# Patient Record
Sex: Female | Born: 1950 | Race: White | Hispanic: No | Smoking: Former smoker
Health system: Southern US, Community
[De-identification: ages and names within clinical notes are randomized; demographics above are authoritative.]

## PROBLEM LIST (undated history)

## (undated) DIAGNOSIS — M2550 Pain in unspecified joint: Secondary | ICD-10-CM

## (undated) DIAGNOSIS — F32A Depression, unspecified: Secondary | ICD-10-CM

## (undated) DIAGNOSIS — N281 Cyst of kidney, acquired: Secondary | ICD-10-CM

## (undated) DIAGNOSIS — M1990 Unspecified osteoarthritis, unspecified site: Secondary | ICD-10-CM

## (undated) DIAGNOSIS — M549 Dorsalgia, unspecified: Secondary | ICD-10-CM

## (undated) HISTORY — DX: Depression, unspecified: F32.A

## (undated) HISTORY — DX: Unspecified osteoarthritis, unspecified site: M19.90

## (undated) HISTORY — DX: Dorsalgia, unspecified: M54.9

## (undated) HISTORY — DX: Pain in unspecified joint: M25.50

## (undated) HISTORY — PX: CHOLECYSTECTOMY: SHX55

## (undated) HISTORY — PX: GASTRIC BYPASS: SHX52

## (undated) HISTORY — PX: BLADDER SUSPENSION: SHX72

---

## 2020-01-29 LAB — COLOGUARD: COLOGUARD: NEGATIVE

## 2020-01-29 LAB — EXTERNAL GENERIC LAB PROCEDURE: COLOGUARD: NEGATIVE

## 2021-08-26 ENCOUNTER — Emergency Department
Admission: EM | Admit: 2021-08-26 | Discharge: 2021-08-26 | Disposition: A | Discharge status: Home / Self Care | Payer: No Typology Code available for payment source | Attending: Emergency Medicine | Admitting: Emergency Medicine

## 2021-08-26 ENCOUNTER — Other Ambulatory Visit: Payer: Self-pay

## 2021-08-26 ENCOUNTER — Encounter: Payer: Self-pay | Admitting: Emergency Medicine

## 2021-08-26 DIAGNOSIS — N72 Inflammatory disease of cervix uteri: Secondary | ICD-10-CM | POA: Insufficient documentation

## 2021-08-26 DIAGNOSIS — N898 Other specified noninflammatory disorders of vagina: Secondary | ICD-10-CM | POA: Diagnosis present

## 2021-08-26 LAB — URINALYSIS, COMPLETE (UACMP) WITH MICROSCOPIC
Bilirubin Urine: NEGATIVE
Glucose, UA: NEGATIVE mg/dL
Hgb urine dipstick: NEGATIVE
Ketones, ur: NEGATIVE mg/dL
Nitrite: NEGATIVE
Protein, ur: NEGATIVE mg/dL
Specific Gravity, Urine: 1.025 (ref 1.005–1.030)
WBC, UA: >50 WBC/hpf — ABNORMAL HIGH (ref 0–5)
pH: 5 (ref 5.0–8.0)

## 2021-08-26 LAB — CHLAMYDIA/NGC RT PCR (ARMC ONLY)
Chlamydia Tr: NOT DETECTED
N gonorrhoeae: DETECTED — AB

## 2021-08-26 LAB — WET PREP, GENITAL
Clue Cells Wet Prep HPF POC: NONE SEEN
Sperm: NONE SEEN
Trich, Wet Prep: NONE SEEN
WBC, Wet Prep HPF POC: >10 — AB (ref <10)

## 2021-08-26 MED ORDER — DOXYCYCLINE MONOHYDRATE 100 MG PO TABS
100.0000 mg | ORAL_TABLET | Freq: Two times a day (BID) | ORAL | 0 refills | Status: AC
Start: 2021-08-26 — End: 2021-09-02

## 2021-08-26 MED ORDER — DOXYCYCLINE HYCLATE 100 MG PO TABS
100.0000 mg | ORAL_TABLET | Freq: Once | ORAL | Status: AC
  Administered 2021-08-26: 100 mg via ORAL
  Filled 2021-08-26: qty 1

## 2021-08-26 MED ORDER — FLUCONAZOLE 100 MG PO TABS
150.0000 mg | ORAL_TABLET | Freq: Once | ORAL | Status: AC
Start: 2021-08-26 — End: 2021-08-26
  Administered 2021-08-26: 150 mg via ORAL
  Filled 2021-08-26: qty 1

## 2021-08-26 MED ORDER — CEFTRIAXONE SODIUM 1 G IJ SOLR
500.0000 mg | Freq: Once | INTRAMUSCULAR | Status: AC
  Administered 2021-08-26: 500 mg via INTRAMUSCULAR
  Filled 2021-08-26: qty 10

## 2021-08-26 NOTE — ED Provider Notes (Signed)
? ?[facility] ?Provider Note ? ? ? Event Date/Time  ? First MD Initiated Contact with Patient 08/26/21 1729   ?  (approximate) ? ? ?History  ? ?Vaginal Discharge ? ? ?HPI ? ?Yvonne Eaton is a 71 y.o. female with no significant past medical history presents with vaginal discharge.  Patient notes that she had unprotected intercourse with a new partner about a week ago.  He then told her that she likely has an STD.  She went to urgent care initially and was tested for STDs including HIV syphilis trichomonas gonorrhea and chlamydia but was not treated.  The gonorrhea and Chlamydia test of still not come back.  Over the last several days she has noticed brown discharge.  After intercourse she did use a douche.  Denies abdominal pain dysuria, fevers or chills ?  ? ?No past medical history on file. ? ?There are no problems to display for this patient. ? ? ? ?Physical Exam  ?Triage Vital Signs: ?ED Triage Vitals  ?Enc Vitals Group  ?   BP 08/26/21 1713 (!) 159/82  ?   Pulse Rate 08/26/21 1713 72  ?   Resp 08/26/21 1713 20  ?   Temp 08/26/21 1713 98.4 ?F (36.9 ?C)  ?   Temp Source 08/26/21 1713 Oral  ?   SpO2 08/26/21 1713 99 %  ?   Weight 08/26/21 1710 151 lb (68.5 kg)  ?   Height 08/26/21 1710 5\' 3"  (1.6 m)  ?   Head Circumference --   ?   Peak Flow --   ?   Pain Score 08/26/21 1709 0  ?   Pain Loc --   ?   Pain Edu? --   ?   Excl. in GC? --   ? ? ?Most recent vital signs: ?Vitals:  ? 08/26/21 1713  ?BP: (!) 159/82  ?Pulse: 72  ?Resp: 20  ?Temp: 98.4 ?F (36.9 ?C)  ?SpO2: 99%  ? ? ? ?General: Awake, no distress.  ?CV:  Good peripheral perfusion.  ?Resp:  Normal effort.  ?Abd:  No distention.  ?Neuro:             Awake, Alert, Oriented x 3  ?Other:  Increased Greenish-yellow discharge on speculum exam with friable cervix ?No cervical motion tenderness ? ? ?ED Results / Procedures / Treatments  ?Labs ?(all labs ordered are listed, but only abnormal results are displayed) ?Labs Reviewed  ?WET PREP,  GENITAL - Abnormal; Notable for the following components:  ?    Result Value  ? Yeast Wet Prep HPF POC PRESENT (*)   ? WBC, Wet Prep HPF POC >10 (*)   ? All other components within normal limits  ?URINALYSIS, COMPLETE (UACMP) WITH MICROSCOPIC - Abnormal; Notable for the following components:  ? Color, Urine YELLOW (*)   ? APPearance HAZY (*)   ? Leukocytes,Ua LARGE (*)   ? WBC, UA >50 (*)   ? Bacteria, UA RARE (*)   ? All other components within normal limits  ?CHLAMYDIA/NGC RT PCR ([facility] ONLY)            ? ? ? ?EKG ? ? ? ? ?RADIOLOGY ? ? ? ?PROCEDURES: ? ?Critical Care performed: No ? ? ?MEDICATIONS ORDERED IN ED: ?Medications  ?cefTRIAXone (ROCEPHIN) injection 500 mg (500 mg Intramuscular Given 08/26/21 1843)  ?doxycycline (VIBRA-TABS) tablet 100 mg (100 mg Oral Given 08/26/21 1846)  ?fluconazole (DIFLUCAN) tablet 150 mg (150 mg Oral Given 08/26/21 1910)  ? ? ? ?  IMPRESSION / MDM / ASSESSMENT AND PLAN / ED COURSE  ?I reviewed the triage vital signs and the nursing notes. ?             ?               ? ?Differential diagnosis includes, but is not limited to, gonorrhea chlamydia cervicitis, trichomonas, BV, yeast infection, irritant dermatitis ? ?Patient is a 71 year old female presents with vaginal discharge after having unprotected intercourse with a new partner.  Initially went to urgent care and had STD testing however on review of records I do not see that the gonorrhea and Chlamydia test ever resulted.  She is now having vaginal discharge.  On exam she has copious yellow/green frothy discharge with a somewhat friable cervix.  Looks to me like trichomoniasis or chlamydia gonorrhea.  She has no cervical motion tenderness no abdominal tenderness or fever to suggest PID.  Wet prep is negative for clue cells and trichomoniasis but does have yeast present.  UA with greater than 50 WBCs, spectrums in the setting of the cervicitis.  Patient prefers to be treated empirically for gonorrhea and chlamydia which we will do.   She was given IM ceftriaxone in the first dose of doxycycline.  Also got fluconazole for potential yeast infection.  Discussed checking her results on MyChart and refraining from intercourse for 1 week..  She is appropriate discharge. ? ?Clinical Course as of 08/26/21 1912  ?Sun Aug 26, 2021  ?1901 WBC, Wet Prep HPF POC(!): >10 [KM]  ?  ?Clinical Course User Index ?[KM] Georga Hacking, MD  ? ? ? ?FINAL CLINICAL IMPRESSION(S) / ED DIAGNOSES  ? ?Final diagnoses:  ?Cervicitis  ? ? ? ?Rx / DC Orders  ? ?ED Discharge Orders   ? ?      Ordered  ?  doxycycline (ADOXA) 100 MG tablet  2 times daily       ? 08/26/21 1904  ? ?  ?  ? ?  ? ? ? ?Note:  This document was prepared using Dragon voice recognition software and may include unintentional dictation errors. ?  ?Georga Hacking, MD ?08/26/21 1912 ? ?

## 2021-08-26 NOTE — ED Notes (Signed)
Pelvic cart at door 

## 2021-08-26 NOTE — ED Triage Notes (Addendum)
Pt via POV from home. Pt states she had intercourse with someone and they told her she needed to get checked. States she also reused a douche bottle yesterday. Pt started having some vaginal discharge, yellowish color. Denies any pain. Denies any troubles peeing. States that she got tested for many different STDs on Tuesday. Pt is A&OX4 and NAD.  ?

## 2021-08-26 NOTE — Discharge Instructions (Signed)
Your gonorrhea and Chlamydia test are pending.  We have started the treatment for this with one shot in the emergency department and you will be discharged with prescription for doxycycline which she should take twice a day for the next week.  Your testing for trichomonas and BV were negative however did look like there was yeast as well which we have treated you for.  Please refrain from intercourse until you are fully treated. ?

## 2021-08-26 NOTE — ED Notes (Signed)
Pt here for STD testing after having some abnormal vaginal discharge and a sexual partner who told her to get tested- pt already provided urine sample in triage ?

## 2021-08-27 ENCOUNTER — Telehealth: Payer: Self-pay | Admitting: Emergency Medicine

## 2021-08-27 NOTE — Telephone Encounter (Signed)
Called patient and informed of STD results positive for gonorrhea.  She was treated and she has gotten her presciption for doxycycline and is taking that.  Advised to follow up with her doctor.  No intercourse for at least 7i days and inform partner. ?

## 2021-08-31 ENCOUNTER — Emergency Department
Admission: EM | Admit: 2021-08-31 | Discharge: 2021-08-31 | Disposition: A | Discharge status: Home / Self Care | Payer: 59 | Attending: Emergency Medicine | Admitting: Emergency Medicine

## 2021-08-31 ENCOUNTER — Other Ambulatory Visit: Payer: Self-pay

## 2021-08-31 ENCOUNTER — Encounter: Payer: Self-pay | Admitting: Intensive Care

## 2021-08-31 DIAGNOSIS — R319 Hematuria, unspecified: Secondary | ICD-10-CM | POA: Insufficient documentation

## 2021-08-31 DIAGNOSIS — N898 Other specified noninflammatory disorders of vagina: Secondary | ICD-10-CM | POA: Insufficient documentation

## 2021-08-31 MED ORDER — LIDOCAINE HCL (PF) 1 % IJ SOLN
2.0000 mL | Freq: Once | INTRAMUSCULAR | Status: AC
  Administered 2021-08-31: 2 mL
  Filled 2021-08-31: qty 5

## 2021-08-31 MED ORDER — CEFTRIAXONE SODIUM 1 G IJ SOLR
1.0000 g | Freq: Once | INTRAMUSCULAR | Status: AC
  Administered 2021-08-31: 1 g via INTRAMUSCULAR
  Filled 2021-08-31: qty 10

## 2021-08-31 NOTE — ED Provider Notes (Signed)
? ?[facility] ?Provider Note ? ? ? Event Date/Time  ? First MD Initiated Contact with Patient 08/31/21 1748   ?  (approximate) ? ? ?History  ? ?Hematuria ? ? ?HPI ? ?Yvonne Eaton is a 71 y.o. female   with no significant past medical history who was recently diagnosed with gonorrhea 5 days ago, treated with IM Rocephin and oral doxycycline along with Diflucan who comes the ED reporting that she has noticed some red in her urine, some persistent vaginal discharge which has improved but not completely resolved.  She denies any pain fever or chills.  No dysuria frequency or urgency.  She is scheduled to see her primary care doctor in 3 days. ? ?  ? ? ?Physical Exam  ? ?Triage Vital Signs: ?ED Triage Vitals  ?Enc Vitals Group  ?   BP 08/31/21 1708 (!) 146/68  ?   Pulse Rate 08/31/21 1708 84  ?   Resp 08/31/21 1708 16  ?   Temp 08/31/21 1708 97.9 ?F (36.6 ?C)  ?   Temp Source 08/31/21 1708 Oral  ?   SpO2 08/31/21 1708 96 %  ?   Weight 08/31/21 1709 151 lb (68.5 kg)  ?   Height 08/31/21 1709 5' 3.25" (1.607 m)  ?   Head Circumference --   ?   Peak Flow --   ?   Pain Score 08/31/21 1709 0  ?   Pain Loc --   ?   Pain Edu? --   ?   Excl. in GC? --   ? ? ?Most recent vital signs: ?Vitals:  ? 08/31/21 1708  ?BP: (!) 146/68  ?Pulse: 84  ?Resp: 16  ?Temp: 97.9 ?F (36.6 ?C)  ?SpO2: 96%  ? ? ? ?General: Awake, no distress.  ?CV:  Good peripheral perfusion.  Regular rate and rhythm ?Resp:  Normal effort.  Clear to auscultation bilaterally ?Abd:  No distention.  Soft nontender ?Other:  Moist oral mucosa ? ? ?ED Results / Procedures / Treatments  ? ?Labs ?(all labs ordered are listed, but only abnormal results are displayed) ?Labs Reviewed - No data to display ? ? ?EKG ? ? ? ? ?RADIOLOGY ? ? ? ? ?PROCEDURES: ? ?Critical Care performed: No ? ?Procedures ? ? ?MEDICATIONS ORDERED IN ED: ?Medications  ?cefTRIAXone (ROCEPHIN) injection 1 g (has no administration in time range)  ?lidocaine (PF) (XYLOCAINE) 1 %  injection 2 mL (has no administration in time range)  ? ? ? ?IMPRESSION / MDM / ASSESSMENT AND PLAN / ED COURSE  ?I reviewed the triage vital signs and the nursing notes. ?             ?               ?Patient presents with report of persistent vaginal discharge after recent treatment for gonorrhea.  We will give a repeat dose of Rocephin today, advised her to continue her full course of doxycycline.  Keep her appointment with her doctor.  Counseled her on needing to have repeat evaluation and testing once treatment course is complete to ensure that any bleeding and discharge have resolved and determine if pelvic ultrasound or gynecology referral is necessary. ? ? ?FINAL CLINICAL IMPRESSION(S) / ED DIAGNOSES  ? ?Final diagnoses:  ?Vaginal discharge  ? ? ? ?Rx / DC Orders  ? ?ED Discharge Orders   ? ? None  ? ?  ? ? ? ?Note:  This document was prepared using Dragon voice  recognition software and may include unintentional dictation errors. ?  ?Sharman Cheek, MD ?08/31/21 1854 ? ?

## 2021-08-31 NOTE — ED Triage Notes (Signed)
Patient seen here on 08/26/21 and diagnosed with STD. Reports she has 5 doses left of antibiotic and today noticed red in urine and red on her pad. Denies other urinary symptoms ?

## 2021-08-31 NOTE — Discharge Instructions (Addendum)
Follow up with your doctor for repeat evaluation and testing to ensure that the bleeding and discharge resolve after treatment.  If symptoms have not resolved within a week, please follow-up with gynecology. ?

## 2021-08-31 NOTE — ED Notes (Signed)
Pt verbalized understanding of discharge instructions and follow-up care instructions. Pt advised if symptoms worsen to return to ED.  

## 2021-08-31 NOTE — ED Notes (Addendum)
See triage note. Pt has been feeling tired more than lately. Pt thinks it is due to being on antibiotics due to being diagnosed with STD a few days ago. Pt states she has noticed red color in her urine that started today.  ?

## 2021-10-19 ENCOUNTER — Other Ambulatory Visit: Payer: Self-pay | Admitting: Orthopaedic Surgery

## 2021-10-19 DIAGNOSIS — Z01818 Encounter for other preprocedural examination: Secondary | ICD-10-CM

## 2021-10-24 ENCOUNTER — Other Ambulatory Visit: Payer: No Typology Code available for payment source

## 2021-10-29 ENCOUNTER — Ambulatory Visit
Admission: RE | Admit: 2021-10-29 | Discharge: 2021-10-29 | Disposition: A | Discharge status: Home / Self Care | Payer: 59 | Source: Ambulatory Visit | Attending: Orthopaedic Surgery | Admitting: Orthopaedic Surgery

## 2021-10-29 DIAGNOSIS — Z01818 Encounter for other preprocedural examination: Secondary | ICD-10-CM

## 2022-03-20 ENCOUNTER — Other Ambulatory Visit: Payer: Self-pay

## 2022-03-20 ENCOUNTER — Emergency Department: Payer: 59

## 2022-03-20 ENCOUNTER — Emergency Department
Admission: EM | Admit: 2022-03-20 | Discharge: 2022-03-20 | Disposition: A | Discharge status: Home / Self Care | Payer: 59 | Attending: Emergency Medicine | Admitting: Emergency Medicine

## 2022-03-20 ENCOUNTER — Encounter: Payer: Self-pay | Admitting: *Deleted

## 2022-03-20 DIAGNOSIS — W19XXXA Unspecified fall, initial encounter: Secondary | ICD-10-CM

## 2022-03-20 DIAGNOSIS — S0990XA Unspecified injury of head, initial encounter: Secondary | ICD-10-CM | POA: Diagnosis present

## 2022-03-20 DIAGNOSIS — W108XXA Fall (on) (from) other stairs and steps, initial encounter: Secondary | ICD-10-CM | POA: Diagnosis not present

## 2022-03-20 DIAGNOSIS — M25511 Pain in right shoulder: Secondary | ICD-10-CM | POA: Diagnosis not present

## 2022-03-20 MED ORDER — OXYCODONE-ACETAMINOPHEN 5-325 MG PO TABS
1.0000 | ORAL_TABLET | Freq: Once | ORAL | Status: AC
  Administered 2022-03-20: 1 via ORAL
  Filled 2022-03-20: qty 1

## 2022-03-20 NOTE — ED Triage Notes (Addendum)
BIB  [facility] from home s/p fall, tripped over purse, fell down 3 steps, hit head, denies blood thinners, no LOC, c/o R shoulder and neck pain, R elbow abrasion noted. Denies recent falls. TD <5 yrs. CMS intact. BP elevated. No IV, no CBG. C-collar in place.

## 2022-03-20 NOTE — Discharge Instructions (Addendum)
Your x-ray did not show any broken bone in your shoulder.  You likely bruised it or may have injured your rotator cuff.  You can wear the sling but only do so for 5 days maximum and make sure you are taking your arm out and mobilizing your shoulder so that you avoid getting a frozen shoulder.  You can take Tylenol Motrin and your tramadol as needed.  Please follow-up with your orthopedist.

## 2022-03-20 NOTE — ED Provider Notes (Signed)
[facility] Provider Note    Event Date/Time   First MD Initiated Contact with Patient 03/20/22 1813     (approximate)   History   Fall   HPI  Yvonne Eaton is a 71 y.o. female past medical history of recurrent UTI who presents after a fall.  Patient was getting out of the car when she tripped on her purse fell backward into the right.  She did hit her head did not lose consciousness.  She hit her right shoulder.  She has osteoarthritis of the right shoulder and is planning to get a shoulder replacement but now pain is much worse.  Denies numbness or tingling.  Denies any lower extremity symptoms.  No chest pain or abdominal pain.  She is not anticoagulated.     History reviewed. No pertinent past medical history.  There are no problems to display for this patient.    Physical Exam  Triage Vital Signs: ED Triage Vitals  Enc Vitals Group     BP 03/20/22 1805 (!) 178/89     Pulse Rate 03/20/22 1805 79     Resp --      Temp --      Temp src --      SpO2 03/20/22 1805 100 %     Weight 03/20/22 1811 189 lb (85.7 kg)     Height 03/20/22 1811 5\' 3"  (1.6 m)     Head Circumference --      Peak Flow --      Pain Score 03/20/22 1811 9     Pain Loc --      Pain Edu? --      Excl. in GC? --     Most recent vital signs: Vitals:   03/20/22 1939 03/20/22 2023  BP:  (!) 177/82  Pulse:  66  Resp:  20  Temp: 98.4 F (36.9 C) 98.3 F (36.8 C)  SpO2:  100%     General: Awake, no distress.  CV:  Good peripheral perfusion.  Resp:  Normal effort.  Abd:  No distention.  Neuro:             Awake, Alert, Oriented x 3  Other:  Tenderness to palpation over the right AC joint, able to fully flex but significant pain with abduction 2+ radial pulse, intact thumbs up, okay sign and finger abduction Small abrasion over the elbow No signs of trauma to the head, PERRLA  C-collar in place   ED Results / Procedures / Treatments  Labs (all labs ordered are  listed, but only abnormal results are displayed) Labs Reviewed - No data to display   EKG     RADIOLOGY I reviewed and interpreted the CT scan of the brain which does not show any acute intracranial process    PROCEDURES:  Critical Care performed: No  .1-3 Lead EKG Interpretation  Performed by: 05/20/22, MD Authorized by: Georga Hacking, MD     Interpretation: normal     ECG rate assessment: normal     Rhythm: sinus rhythm     Ectopy: none     Conduction: normal     The patient is on the cardiac monitor to evaluate for evidence of arrhythmia and/or significant heart rate changes.   MEDICATIONS ORDERED IN ED: Medications  oxyCODONE-acetaminophen (PERCOCET/ROXICET) 5-325 MG per tablet 1 tablet (1 tablet Oral Given 03/20/22 1938)     IMPRESSION / MDM / ASSESSMENT AND PLAN / ED COURSE  I  reviewed the triage vital signs and the nursing notes.                              Patient's presentation is most consistent with acute presentation with potential threat to life or bodily function.  Differential diagnosis includes, but is not limited to, cervical spine fracture, intracranial hemorrhage, AC joint separation, humeral head fracture,  Patient is a 71 year old female who presents after mechanical fall.  [place] [address] backwards getting out of the car today because she tripped over her purse.  She hit her head but did not lose consciousness.  She complains of pain on the right side of her neck and right shoulder.  She does have difficulty ranging the shoulder with abduction and is tender over the [facility] joint there is no obvious deformity and she is distally neurovascular intact.  She has no chest wall or abdominal tenderness.  Plan to obtain CT head and C-spine as well as x-ray of the right shoulder and treat her pain with Percocet.  X-ray of the shoulder has no acute findings.  CT head and C-spine are also negative.  Patient placed in sling for comfort but advised early  range of motion exercises to avoid frozen shoulder.  She will follow-up with her orthopedist.     FINAL CLINICAL IMPRESSION(S) / ED DIAGNOSES   Final diagnoses:  Acute pain of right shoulder  Fall, initial encounter     Rx / DC Orders   ED Discharge Orders     None        Note:  This document was prepared using Dragon voice recognition software and may include unintentional dictation errors.   Rada Hay, MD 03/21/22 [phone]

## 2022-03-20 NOTE — ED Notes (Addendum)
EDP into room, at [facility]. Guarding R shoulder.

## 2022-09-02 ENCOUNTER — Other Ambulatory Visit: Payer: Self-pay | Admitting: Student

## 2022-09-02 DIAGNOSIS — Z1231 Encounter for screening mammogram for malignant neoplasm of breast: Secondary | ICD-10-CM

## 2023-02-04 ENCOUNTER — Other Ambulatory Visit: Payer: Self-pay | Admitting: Nurse Practitioner

## 2023-02-04 DIAGNOSIS — Z1231 Encounter for screening mammogram for malignant neoplasm of breast: Secondary | ICD-10-CM

## 2023-02-05 ENCOUNTER — Other Ambulatory Visit: Payer: Self-pay | Admitting: Nurse Practitioner

## 2023-02-05 DIAGNOSIS — E2839 Other primary ovarian failure: Secondary | ICD-10-CM

## 2023-02-11 ENCOUNTER — Ambulatory Visit
Admission: RE | Admit: 2023-02-11 | Discharge: 2023-02-11 | Disposition: A | Discharge status: Home / Self Care | Payer: Medicare HMO | Source: Ambulatory Visit | Attending: Nurse Practitioner | Admitting: Nurse Practitioner

## 2023-02-11 DIAGNOSIS — Z1231 Encounter for screening mammogram for malignant neoplasm of breast: Secondary | ICD-10-CM

## 2023-02-28 ENCOUNTER — Encounter (HOSPITAL_COMMUNITY): Payer: Self-pay | Admitting: Physician Assistant

## 2023-02-28 ENCOUNTER — Telehealth (HOSPITAL_COMMUNITY): Payer: Self-pay

## 2023-02-28 ENCOUNTER — Ambulatory Visit (HOSPITAL_COMMUNITY)
Admission: EM | Admit: 2023-02-28 | Discharge: 2023-02-28 | Disposition: A | Discharge status: Home / Self Care | Payer: Medicare HMO | Attending: Physician Assistant | Admitting: Physician Assistant

## 2023-02-28 DIAGNOSIS — M5432 Sciatica, left side: Secondary | ICD-10-CM

## 2023-02-28 DIAGNOSIS — G5702 Lesion of sciatic nerve, left lower limb: Secondary | ICD-10-CM | POA: Diagnosis not present

## 2023-02-28 MED ORDER — BACLOFEN 5 MG PO TABS: 5.0000 mg | ORAL_TABLET | Freq: Every evening | ORAL | 0 refills | Status: DC | PRN

## 2023-02-28 MED ORDER — KETOROLAC TROMETHAMINE 30 MG/ML IJ SOLN
15.0000 mg | Freq: Once | INTRAMUSCULAR | Status: AC
  Administered 2023-02-28: 15 mg via INTRAMUSCULAR

## 2023-02-28 MED ORDER — TRAMADOL HCL 50 MG PO TABS: 50.0000 mg | ORAL_TABLET | Freq: Two times a day (BID) | ORAL | 0 refills | Status: AC | PRN

## 2023-02-28 MED ORDER — TRAMADOL HCL 50 MG PO TABS: 50.0000 mg | ORAL_TABLET | Freq: Two times a day (BID) | ORAL | 0 refills | Status: DC | PRN

## 2023-02-28 MED ORDER — KETOROLAC TROMETHAMINE 30 MG/ML IJ SOLN
INTRAMUSCULAR | Status: AC
  Filled 2023-02-28: qty 1

## 2023-02-28 NOTE — ED Provider Notes (Addendum)
[facility]    CSN: [id number] Arrival date & time: 02/28/23  1716      History   Chief Complaint Chief Complaint  Patient presents with   Back Pain   Leg Pain    HPI Yvonne Eaton is a 72 y.o. female.   Patient presents today with a several hour history of worsening left buttocks pain with radiation to her left leg.  She does have a history of intermittent back pain.  She has taken Celebrex as previously prescribed without improvement of symptoms.  She has not tried additional over-the-counter medications.  Denies previous injury or surgery involving her back.  She denies any known injury or increase in activity before symptoms began.  Pain is radiating between 6 and 8 on a 0-10 pain scale, described as shooting, worse with certain movements/prolonged standing or ambulation, no alleviating factors identified.  She denies any bowel/bladder incontinence, lower extremity weakness, saddle anesthesia.  She denies any fever, nausea, vomiting, urinary symptoms.  She is having difficulty with her daily activities as a result of symptoms.  Denies personal history of malignancy.    History reviewed. No pertinent past medical history.  There are no problems to display for this patient.   Past Surgical History:  Procedure Laterality Date   CHOLECYSTECTOMY     GASTRIC BYPASS      OB History   No obstetric history on file.      Home Medications    Prior to Admission medications   Medication Sig Start Date End Date Taking? Authorizing Provider  Baclofen 5 MG TABS Take 1 tablet (5 mg total) by mouth at bedtime as needed. 02/28/23  Yes Gabrial Domine K, PA-C  traMADol (ULTRAM) 50 MG tablet Take 1 tablet (50 mg total) by mouth every 12 (twelve) hours as needed for up to 2 days. 02/28/23 03/02/23 Yes Tasheema Perrone, Noberto Retort, PA-C    Family History History reviewed. No pertinent family history.  Social History Social History   Tobacco Use   Smoking status: Never   Smokeless  tobacco: Never  Substance Use Topics   Alcohol use: Yes    Comment: occasionally   Drug use: Yes    Types: Marijuana     Allergies   Morphine   Review of Systems Review of Systems  Constitutional:  Positive for activity change. Negative for appetite change, fatigue and fever.  Gastrointestinal:  Negative for abdominal pain, diarrhea, nausea and vomiting.  Musculoskeletal:  Positive for back pain. Negative for arthralgias and myalgias.  Neurological:  Positive for numbness. Negative for weakness.     Physical Exam Triage Vital Signs ED Triage Vitals  Encounter Vitals Group     BP 02/28/23 1805 (!) 166/75     Systolic BP Percentile --      Diastolic BP Percentile --      Pulse Rate 02/28/23 1805 68     Resp 02/28/23 1805 16     Temp 02/28/23 1805 98.1 F (36.7 C)     Temp Source 02/28/23 1805 Oral     SpO2 02/28/23 1805 97 %     Weight --      Height --      Head Circumference --      Peak Flow --      Pain Score 02/28/23 1807 6     Pain Loc --      Pain Education --      Exclude from Growth Chart --    No data found.  Updated Vital Signs BP (!) 166/75 (BP Location: Left Arm)   Pulse 68   Temp 98.1 F (36.7 C) (Oral)   Resp 16   SpO2 97%   Visual Acuity Right Eye Distance:   Left Eye Distance:   Bilateral Distance:    Right Eye Near:   Left Eye Near:    Bilateral Near:     Physical Exam Vitals reviewed.  Constitutional:      General: She is awake. She is not in acute distress.    Appearance: Normal appearance. She is well-developed. She is not ill-appearing.     Comments: Very pleasant female appears stated age in no acute distress sitting comfortably in exam room  HENT:     Head: Normocephalic and atraumatic.  Cardiovascular:     Rate and Rhythm: Normal rate and regular rhythm.     Heart sounds: S1 normal and S2 normal. Murmur heard.  Pulmonary:     Effort: Pulmonary effort is normal.     Breath sounds: Normal breath sounds. No wheezing,  rhonchi or rales.     Comments: Clear to auscultation bilaterally Musculoskeletal:     Cervical back: No tenderness or bony tenderness.     Thoracic back: No tenderness or bony tenderness.     Lumbar back: Tenderness present. No bony tenderness. Positive left straight leg raise test. Negative right straight leg raise test.     Comments: Back: Tender to palpation over lumbar/buttocks on the left.  Positive straight leg raise.  Strength 5/5.  No deformity or step-off noted.  Psychiatric:        Behavior: Behavior is cooperative.      UC Treatments / Results  Labs (all labs ordered are listed, but only abnormal results are displayed) Labs Reviewed - No data to display  EKG   Radiology No results found.  Procedures Procedures (including critical care time)  Medications Ordered in UC Medications  ketorolac (TORADOL) 30 MG/ML injection 15 mg (15 mg Intramuscular Given 02/28/23 1906)    Initial Impression / Assessment and Plan / UC Course  I have reviewed the triage vital signs and the nursing notes.  Pertinent labs & imaging results that were available during my care of the patient were reviewed by me and considered in my medical decision making (see chart for details).     Patient is well-appearing, afebrile, nontoxic, nontachycardic.  Vital signs of physical exam are reassuring with no indication for emergent evaluation or imaging.  She was given 15 mg of Toradol in clinic to help with pain and inflammation.  Discussed that she is not to take Celebrex for an additional 12 hours and she should limit use of this medication given her history of gastric bypass.  Metabolic panel from 08/26/2022 showed normal kidney function with a creatinine of 0.7.  She was encouraged to use heat and gentle stretch for symptom relief.  She was given low-dose baclofen to be taken at night and we discussed this can be sedating and she should not drive or drink alcohol taking it.  She reports being  previously prescribed tramadol which provided improvement of symptoms without sedation and is requesting this medication if appropriate today.  I given she is unable to take oral NSAIDs and we are limited in pain control we will consider short course of this medication.  She denies any history of seizures.  Review of [place] controlled substance database shows no inappropriate refills.  4 tablets were sent to pharmacy we discussed that we  cannot provide any refills.  Discussed that she should limit use of medication is much as possible that is both sedating and addictive and should space the dose of tramadol away from baclofen to avoid combining sedating medications.  If her symptoms are not improving she should follow-up with sports medicine and was given contact information for local provider with instruction to call to schedule an appointment.  Discussed that if anything worsens or changes she should return for reevaluation.  Final Clinical Impressions(s) / UC Diagnoses   Final diagnoses:  Piriformis syndrome of left side  Left sided sciatica     Discharge Instructions      We gave an injection of ketorolac today.  Please do not take your Celebrex for the next 12 hours.  You can use tramadol up to twice a day.  We can only provide you a few doses of this medication and cannot provide you a refill.  Try to limit use is much as possible as this medication is addictive and sedating.  Take baclofen at night.  This can also make you sleepy so do not drive or drink alcohol taking it.  You can use Tylenol for breakthrough pain.  If your symptoms are not improving quickly please follow-up with sports medicine; call to schedule an appointment.  If anything worsens and you go to the bathroom underside without noticing it, have severe pain, weakness in your legs, numbness or tingling on the inside of your legs you should be seen immediately.     ED Prescriptions     Medication Sig Dispense Auth.  Provider   traMADol (ULTRAM) 50 MG tablet Take 1 tablet (50 mg total) by mouth every 12 (twelve) hours as needed for up to 2 days. 4 tablet Maleah Rabago K, PA-C   Baclofen 5 MG TABS Take 1 tablet (5 mg total) by mouth at bedtime as needed. 10 tablet Terri Rorrer K, PA-C      I have reviewed the PDMP during this encounter.   Jeani Hawking, PA-C 02/28/23 1907    Shantel Wesely, Noberto Retort, PA-C 02/28/23 1908

## 2023-02-28 NOTE — Discharge Instructions (Signed)
We gave an injection of ketorolac today.  Please do not take your Celebrex for the next 12 hours.  You can use tramadol up to twice a day.  We can only provide you a few doses of this medication and cannot provide you a refill.  Try to limit use is much as possible as this medication is addictive and sedating.  Take baclofen at night.  This can also make you sleepy so do not drive or drink alcohol taking it.  You can use Tylenol for breakthrough pain.  If your symptoms are not improving quickly please follow-up with sports medicine; call to schedule an appointment.  If anything worsens and you go to the bathroom underside without noticing it, have severe pain, weakness in your legs, numbness or tingling on the inside of your legs you should be seen immediately.

## 2023-02-28 NOTE — ED Triage Notes (Signed)
Pt presents to the office for left side pain. Pt reports a history of sciatica pain.

## 2023-03-03 ENCOUNTER — Ambulatory Visit: Payer: Medicare HMO | Admitting: Family Medicine

## 2023-03-03 VITALS — BP 138/84 | Ht 63.75 in | Wt 182.0 lb

## 2023-03-03 DIAGNOSIS — M5432 Sciatica, left side: Secondary | ICD-10-CM | POA: Diagnosis not present

## 2023-03-03 MED ORDER — PREDNISONE 10 MG PO TABS: ORAL_TABLET | ORAL | 0 refills | Status: DC

## 2023-03-03 NOTE — Patient Instructions (Signed)
Take tylenol for baseline pain relief (1-2 extra strength tabs 3x/day) Take prednisone dose pack as directed. Stop the celebrex while on the prednisone.  Also do not take ibuprofen while on this. Take one of the tramadol only if pain is severe. you sleepy). Stay as active as possible. Physical therapy has been shown to be helpful as well. Do home exercises and stretches as directed. If not improving, will consider further imaging (MRI), formal physical therapy. Follow up with me in 1 month but call me over the next week if you're not improving.

## 2023-03-03 NOTE — Progress Notes (Unsigned)
PCP: Carren Rang, PA-C  Subjective:   HPI: Patient is a 72 y.o. female here for ED follow-up of sciatic pain.  Felt sudden pain on left side posterior hip that radiated down leg on Friday. Pain is sharp and it aches. Has noticed some tingling as well. Pain returns with activity on her feet. Not seeing a chiropractor here. No urinary or fecal incontinence, no saddle anesthesia. No trauma or new injury.  Seen in urgent care on 09/20. Told she had sciatica. Was given Toradol and tramadol which helped some.  States she has had this pain intermittently for many years. 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  She has previously been on gabapentin which she does not like because she does not feel it works and makes her sleepy.  Currently taking ibuprofen and Tylenol combo arthritis pill as well as celecoxib.  Notes that she has had steroid Dosepaks in the past which have helped.  No past medical history on file.  Current Outpatient Medications on File Prior to Visit  Medication Sig Dispense Refill   Baclofen 5 MG TABS Take 1 tablet (5 mg total) by mouth at bedtime as needed. 10 tablet 0   No current facility-administered medications on file prior to visit.    Past Surgical History:  Procedure Laterality Date   CHOLECYSTECTOMY     GASTRIC BYPASS      Allergies  Allergen Reactions   Morphine     BP 138/84   Ht 5' 3.75" (1.619 m)   Wt 182 lb (82.6 kg)   BMI 31.49 kg/m       No data to display              No data to display              Objective:  Physical Exam:  Gen: NAD, comfortable in exam room  Left hip and lumbar spine: Inspection: No obvious gross deformity, swelling, ecchymoses, erythema Palpation: Nontender to palpation over lumbar spinous processes, paraspinal muscles, left greater  trochanter Mildly tender to palpation at piriformis muscle Range of Motion: Internal rotation of left hip limited to 15 degrees Full range of motion of hip otherwise Special Tests FABER reproduces pain in posterior hip Positive left sided straight leg raise No pain with resisted external rotation of hip Positive piriformis stretch Strength: 5/5 in lumbosacral plexus L2-S2 distribution    Assessment & Plan:   Assessment & Plan Sciatica of left side Patient with 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  Reassuringly no red flag symptoms to suggest damage to her spinal cord and she has not had any new injuries to suggest alternative cause of her pain.  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  Will not repeat imaging at this time as it would not change management. Discussed repeat steroid Dosepak to treat her flareup.  Patient agreeable to this plan.  Also provided lumbar and hip strengthening/stretching exercises.  Follow-up as needed.  Prednisone taper ordered.   Some or all of this note was dictated use Animal nutritionist, any errors are unintentional.  Celine Mans, MD, PGY-2 [facility] 4:38 PM 03/03/2023

## 2023-03-04 ENCOUNTER — Encounter: Payer: Self-pay | Admitting: Family Medicine

## 2023-03-10 ENCOUNTER — Ambulatory Visit: Payer: Medicare HMO | Admitting: Family Medicine

## 2023-03-31 ENCOUNTER — Ambulatory Visit (HOSPITAL_COMMUNITY)
Admission: EM | Admit: 2023-03-31 | Discharge: 2023-03-31 | Disposition: A | Discharge status: Home / Self Care | Payer: Medicare HMO

## 2023-03-31 ENCOUNTER — Encounter (HOSPITAL_COMMUNITY): Payer: Self-pay

## 2023-03-31 ENCOUNTER — Ambulatory Visit (INDEPENDENT_AMBULATORY_CARE_PROVIDER_SITE_OTHER): Payer: Medicare HMO

## 2023-03-31 DIAGNOSIS — J189 Pneumonia, unspecified organism: Secondary | ICD-10-CM | POA: Diagnosis not present

## 2023-03-31 DIAGNOSIS — J209 Acute bronchitis, unspecified: Secondary | ICD-10-CM

## 2023-03-31 MED ORDER — AZITHROMYCIN 250 MG PO TABS: 250.0000 mg | ORAL_TABLET | Freq: Every day | ORAL | 0 refills | Status: DC

## 2023-03-31 MED ORDER — PROMETHAZINE-DM 6.25-15 MG/5ML PO SYRP: 5.0000 mL | ORAL_SOLUTION | Freq: Every evening | ORAL | 0 refills | Status: AC | PRN

## 2023-03-31 MED ORDER — BENZONATATE 100 MG PO CAPS: 100.0000 mg | ORAL_CAPSULE | Freq: Three times a day (TID) | ORAL | 0 refills | Status: DC

## 2023-03-31 MED ORDER — ALBUTEROL SULFATE HFA 108 (90 BASE) MCG/ACT IN AERS
2.0000 | INHALATION_SPRAY | Freq: Once | RESPIRATORY_TRACT | Status: AC
  Administered 2023-03-31: 2 via RESPIRATORY_TRACT

## 2023-03-31 MED ORDER — AMOXICILLIN-POT CLAVULANATE 875-125 MG PO TABS: 1.0000 | ORAL_TABLET | Freq: Two times a day (BID) | ORAL | 0 refills | Status: DC

## 2023-03-31 MED ORDER — ALBUTEROL SULFATE HFA 108 (90 BASE) MCG/ACT IN AERS
INHALATION_SPRAY | RESPIRATORY_TRACT | Status: AC
  Filled 2023-03-31: qty 6.7

## 2023-03-31 NOTE — ED Triage Notes (Signed)
Pt c/o productive cough with green sputum, runny nose, post nasal drip, and chest/nasal congestion for almost a month. Taking OTC with no relief.

## 2023-03-31 NOTE — ED Provider Notes (Signed)
[facility]    CSN: [id number] Arrival date & time: 03/31/23  1035      History   Chief Complaint Chief Complaint  Patient presents with   Cough    HPI Yvonne Eaton is a 71 y.o. female.   Patient presents to urgent care for evaluation of productive cough that has been present for the last approximately 4 weeks as well as nasal congestion, fatigue, and intermittent bilateral frontal headaches.  Cough is productive in the mornings with green sputum, sputum clears up throughout the day.  Reports intermittent mild shortness of breath associated with exertion and cough.  Denies chest pain, heart palpitations, nausea, vomiting, diarrhea, abdominal pain, rash, fever, chills, and bodyaches.  No leg swelling orthopnea reported.  No recent sick contacts with similar symptoms.  Denies recent antibiotic or steroid use.  No history of chronic respiratory problems.  Former smoker (greater than 20 to 30 years ago), smokes marijuana currently, otherwise no other drug use.  She is using over-the-counter medication to help with symptoms without relief.   Cough   History reviewed. No pertinent past medical history.  There are no problems to display for this patient.   Past Surgical History:  Procedure Laterality Date   CHOLECYSTECTOMY     GASTRIC BYPASS      OB History   No obstetric history on file.      Home Medications    Prior to Admission medications   Medication Sig Start Date End Date Taking? Authorizing Provider  amoxicillin-clavulanate (AUGMENTIN) 875-125 MG tablet Take 1 tablet by mouth every 12 (twelve) hours. 03/31/23  Yes Carlisle Beers, FNP  atorvastatin (LIPITOR) 10 MG tablet Take 10 mg by mouth daily. 02/03/23  Yes [provider]  azithromycin (ZITHROMAX) 250 MG tablet Take 1 tablet (250 mg total) by mouth daily. Take first 2 tablets together, then 1 every day until finished. 03/31/23  Yes Carlisle Beers, FNP  benzonatate (TESSALON)  100 MG capsule Take 1 capsule (100 mg total) by mouth every 8 (eight) hours. 03/31/23  Yes Carlisle Beers, FNP  celecoxib (CELEBREX) 200 MG capsule take 1 capsule by mouth twice daily as needed for pain 03/10/23  Yes [provider]  promethazine-dextromethorphan (PROMETHAZINE-DM) 6.25-15 MG/5ML syrup Take 5 mLs by mouth at bedtime as needed for cough. 03/31/23  Yes Carlisle Beers, FNP  Baclofen 5 MG TABS Take 1 tablet (5 mg total) by mouth at bedtime as needed. 02/28/23   Raspet, Noberto Retort, PA-C  buPROPion (WELLBUTRIN) 100 MG tablet Take by mouth.    [provider]  cetirizine (ZYRTEC) 10 MG tablet Take by mouth.    [provider]    Family History History reviewed. No pertinent family history.  Social History Social History   Tobacco Use   Smoking status: Never   Smokeless tobacco: Never  Substance Use Topics   Alcohol use: Yes    Comment: occasionally   Drug use: Yes    Types: Marijuana     Allergies   Morphine   Review of Systems Review of Systems  Respiratory:  Positive for cough.   Per HPI   Physical Exam Triage Vital Signs ED Triage Vitals [03/31/23 1228]  Encounter Vitals Group     BP (!) 168/78     Systolic BP Percentile      Diastolic BP Percentile      Pulse Rate 69     Resp 18     Temp 99.1 F (37.3 C)  Temp Source Oral     SpO2 97 %     Weight      Height      Head Circumference      Peak Flow      Pain Score 3     Pain Loc      Pain Education      Exclude from Growth Chart    No data found.  Updated Vital Signs BP (!) 168/78 (BP Location: Left Arm)   Pulse 69   Temp 99.1 F (37.3 C) (Oral)   Resp 18   SpO2 97%   Visual Acuity Right Eye Distance:   Left Eye Distance:   Bilateral Distance:    Right Eye Near:   Left Eye Near:    Bilateral Near:     Physical Exam Vitals and nursing note reviewed.  Constitutional:      Appearance: She is not ill-appearing or toxic-appearing.  HENT:      Head: Normocephalic and atraumatic.     Right Ear: Hearing, tympanic membrane, ear canal and external ear normal.     Left Ear: Hearing, tympanic membrane, ear canal and external ear normal.     Nose: Congestion present.     Mouth/Throat:     Lips: Pink.     Mouth: Mucous membranes are moist. No injury.     Tongue: No lesions. Tongue does not deviate from midline.     Palate: No mass and lesions.     Pharynx: Oropharynx is clear. Uvula midline. No pharyngeal swelling, oropharyngeal exudate, posterior oropharyngeal erythema or uvula swelling.     Tonsils: No tonsillar exudate or tonsillar abscesses.  Eyes:     General: Lids are normal. Vision grossly intact. Gaze aligned appropriately.     Extraocular Movements: Extraocular movements intact.     Conjunctiva/sclera: Conjunctivae normal.  Cardiovascular:     Rate and Rhythm: Normal rate and regular rhythm.     Heart sounds: Normal heart sounds, S1 normal and S2 normal.  Pulmonary:     Effort: Pulmonary effort is normal. No respiratory distress.     Breath sounds: Normal air entry. No wheezing, rhonchi or rales.     Comments: Coarse breath sounds throughout.  Harsh cough with slight wheeze elicited with deep inspiration.  Speaking in full sentences without difficulty. Chest:     Chest wall: No tenderness.  Musculoskeletal:     Cervical back: Neck supple.  Skin:    General: Skin is warm and dry.     Capillary Refill: Capillary refill takes less than 2 seconds.     Findings: No rash.  Neurological:     General: No focal deficit present.     Mental Status: She is alert and oriented to person, place, and time. Mental status is at baseline.     Cranial Nerves: No dysarthria or facial asymmetry.  Psychiatric:        Mood and Affect: Mood normal.        Speech: Speech normal.        Behavior: Behavior normal.        Thought Content: Thought content normal.        Judgment: Judgment normal.      UC Treatments / Results  Labs (all  labs ordered are listed, but only abnormal results are displayed) Labs Reviewed - No data to display  EKG   Radiology No results found.  Procedures Procedures (including critical care time)  Medications Ordered in UC Medications  albuterol (VENTOLIN  HFA) 108 (90 Base) MCG/ACT inhaler 2 puff (2 puffs Inhalation Given 03/31/23 1336)    Initial Impression / Assessment and Plan / UC Course  I have reviewed the triage vital signs and the nursing notes.  Pertinent labs & imaging results that were available during my care of the patient were reviewed by me and considered in my medical decision making (see chart for details).   1.  Acute bronchitis, shortness of breath Presentation suspicious for acute bronchitis with right lower lobe focal pulmonary infiltrate indicating pneumonia on chest x-ray based on my interpretation of imaging. I will call patient if the radiology reread shows any different interpretation indicating need for change in treatment plan. Will manage this with Augmentin twice daily for 7 days and azithromycin. Tessalon Perles and Promethazine DM as needed. Albuterol inhaler as needed. She is overall well-appearing with hemodynamically stable vital signs, she does not meet criteria for inpatient management of pneumonia.  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  The prednisone made her feel high and she would prefer to not have this again.  She may use the albuterol as needed for shortness of breath.  Counseled patient on potential for adverse effects with medications prescribed/recommended today, strict ER and return-to-clinic precautions discussed, patient verbalized understanding.    Final Clinical Impressions(s) / UC Diagnoses   Final diagnoses:  Community acquired pneumonia of right lower lobe of lung  Acute bronchitis, unspecified organism      Discharge Instructions      You have bronchitis which is inflammation of the upper airways in your lungs due to a virus. The following medicines will help with your symptoms.  Your chest x-ray is also concerning for possible infection (pneumonia) to the right lung. Take Augmentin antibiotic twice a day for 7 days, take azithromycin as prescribed 2 pills today, then 1 pill for 4 days.  - You may use albuterol inhaler 1 to 2 puffs every 4-6 hours as needed for cough, shortness of breath, and wheezing. - Take cough medicines as prescribed.  - Continue using over the counter medicines as needed as directed. Plain mucinex (guaifenesin) over the counter may further help breakup mucus and help with symptoms.   If you develop any new or worsening symptoms or do not improve in the next 2 to 3 days, please return.  If your symptoms are severe, please go to the emergency room. Follow-up with PCP as needed.    ED Prescriptions     Medication Sig Dispense Auth. Provider   amoxicillin-clavulanate (AUGMENTIN) 875-125 MG tablet Take 1 tablet by mouth every 12 (twelve) hours. 14 tablet Carlisle Beers, FNP   azithromycin (ZITHROMAX) 250 MG tablet Take 1 tablet (250 mg total) by mouth daily. Take first 2 tablets together, then 1 every day until finished. 6 tablet Reita May M, FNP   benzonatate (TESSALON) 100 MG capsule Take 1 capsule (100 mg total) by mouth every 8 (eight) hours. 21 capsule Carlisle Beers, FNP   promethazine-dextromethorphan (PROMETHAZINE-DM) 6.25-15 MG/5ML syrup Take 5 mLs by mouth at bedtime as needed for cough. 118 mL Carlisle Beers, FNP      PDMP not reviewed this encounter.   Carlisle Beers, [place] 03/31/23 1346

## 2023-03-31 NOTE — Discharge Instructions (Signed)
You have bronchitis which is inflammation of the upper airways in your lungs due to a virus. The following medicines will help with your symptoms.  Your chest x-ray is also concerning for possible infection (pneumonia) to the right lung. Take Augmentin antibiotic twice a day for 7 days, take azithromycin as prescribed 2 pills today, then 1 pill for 4 days.  - You may use albuterol inhaler 1 to 2 puffs every 4-6 hours as needed for cough, shortness of breath, and wheezing. - Take cough medicines as prescribed.  - Continue using over the counter medicines as needed as directed. Plain mucinex (guaifenesin) over the counter may further help breakup mucus and help with symptoms.   If you develop any new or worsening symptoms or do not improve in the next 2 to 3 days, please return.  If your symptoms are severe, please go to the emergency room. Follow-up with PCP as needed.

## 2023-04-03 IMAGING — CT CT SHOULDER*R* W/O CM
2 series · 10 of 14 positions shown, 12 images · non-contrast
Comparison: None Available.

CLINICAL DATA: Right shoulder pain. History of prior rotator cuff
repair.

EXAM:
CT OF THE UPPER RIGHT EXTREMITY WITHOUT CONTRAST
TECHNIQUE: Multidetector CT imaging of the upper right extremity was performed
according to the standard protocol.
RADIATION DOSE REDUCTION: This exam was performed according to the
departmental dose-optimization program which includes automated
exposure control, adjustment of the mA and/or kV according to
patient size and/or use of iterative reconstruction technique.

[Series 3: (id) st ax · axial · 0.39mm/px · z∈[-178,-86]mm · 3 of 93 slices shown]
[im 24/93  bone]
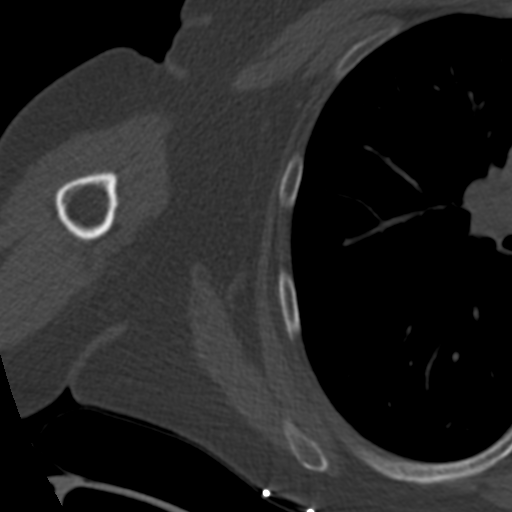
[im 47/93  bone]
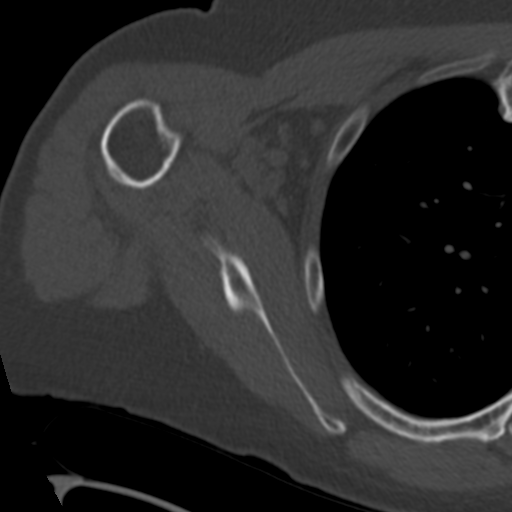
[im 70/93  bone]
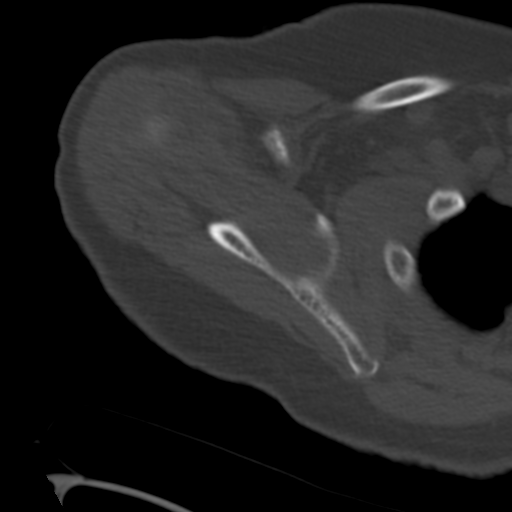

[Series 5: thin st · axial · 0.39mm/px · z∈[-202,-64]mm · 7 of 186 slices shown, 9 images]
[im 24/186  soft-tissue]
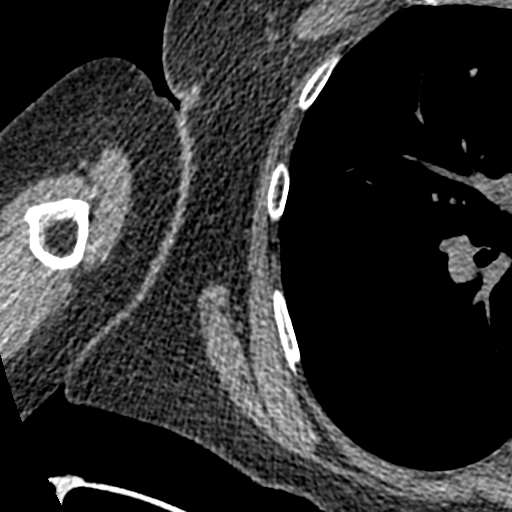
[im 24/186  bone]
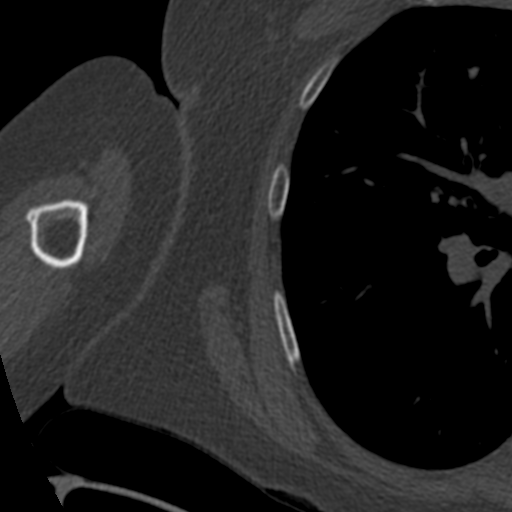
[im 47/186  bone]
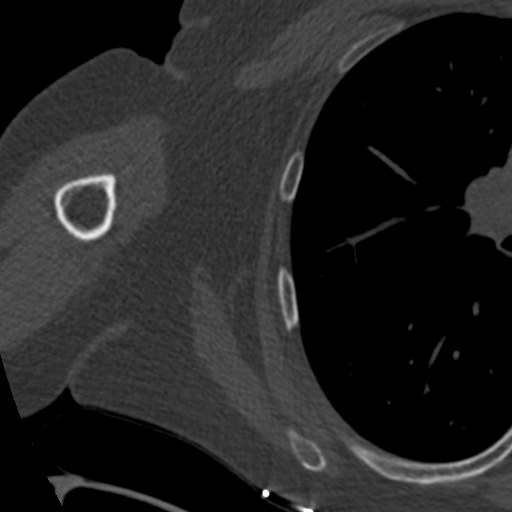
[im 70/186  bone]
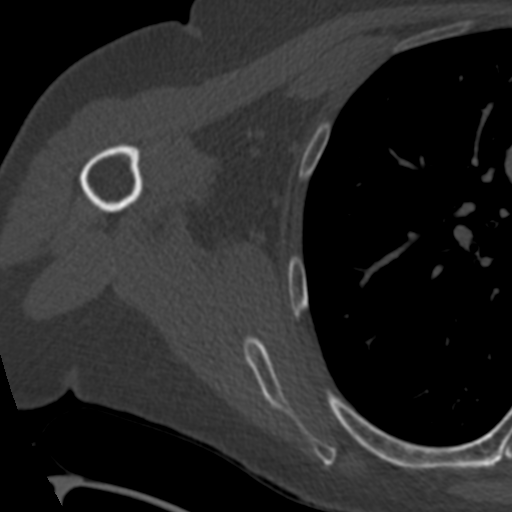
[im 93/186  bone]
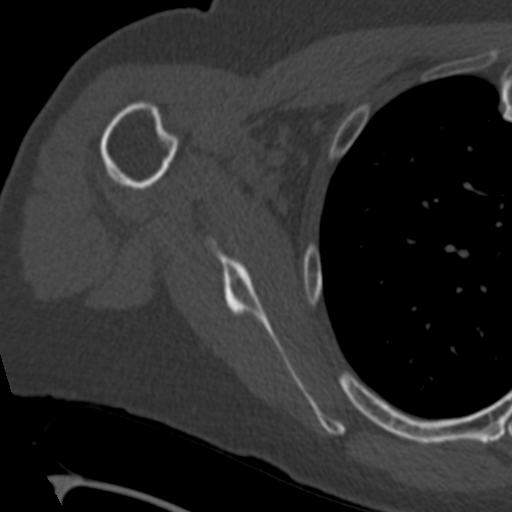
[im 116/186  soft-tissue]
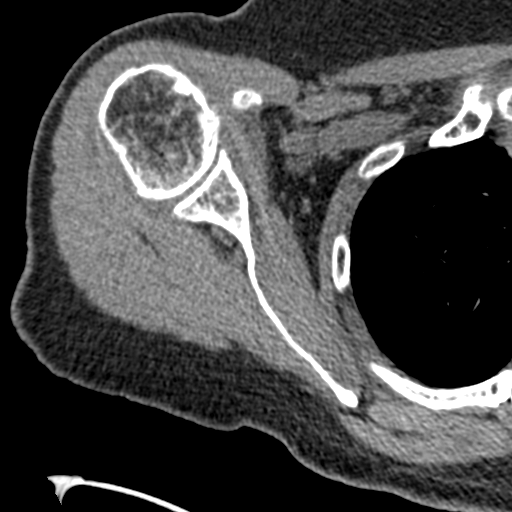
[im 116/186  bone]
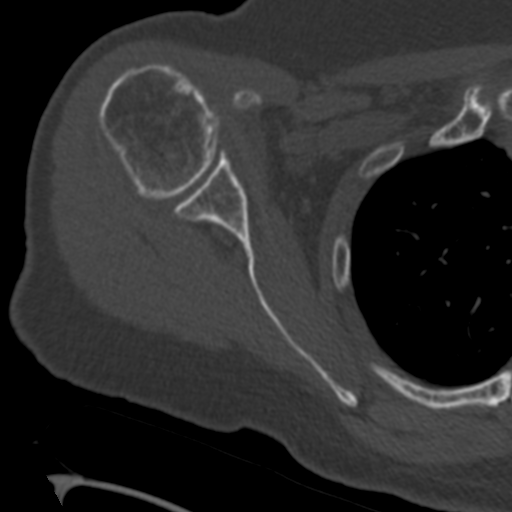
[im 139/186  bone]
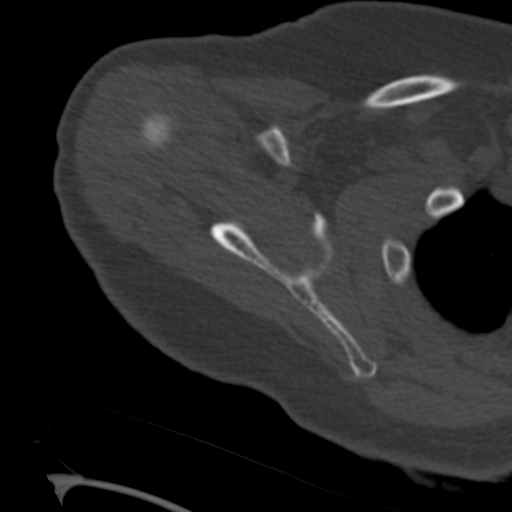
[im 162/186  bone]
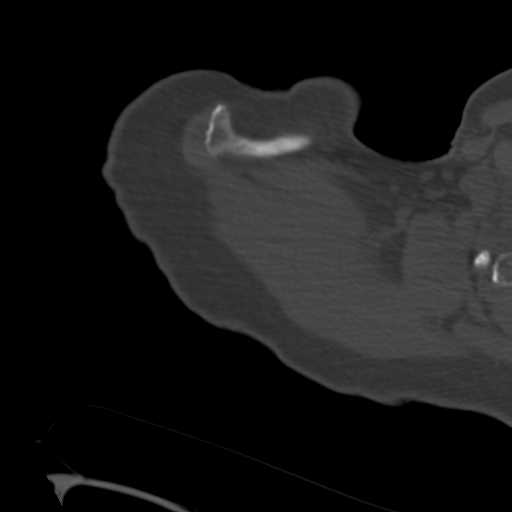

[10 of 14 positions shown; findings below may reference images not displayed]

FINDINGS: Bones/Joint/Cartilage

There is no acute bony or joint abnormality. No focal bony lesion is
identified. Tract for prior rotator repair in the humeral head is
noted. The patient is also status post acromioplasty. There is mild
appearing glenohumeral osteoarthritis with a small osteophyte the
humeral. No subchondral cyst formation. Mild acromioclavicular
osteoarthritis is also noted.

Ligaments

Suboptimally assessed by CT.

Muscles and Tendons

As visualized by CT scan, the rotator cuff appears intact. There is
no muscle atrophy. No focal lesion within muscle is identified.

Soft tissues

Imaged lung parenchyma is clear. No fluid collection or mass is
identified in the imaged soft tissues
IMPRESSION: Status post rotator cuff repair and acromioplasty. Evaluation of the
cuff is limited on CT but no evidence of tear is identified.

The mild appearing glenohumeral acromioclavicular osteoarthritis.

## 2023-05-16 ENCOUNTER — Emergency Department (HOSPITAL_COMMUNITY)
Admission: EM | Admit: 2023-05-16 | Discharge: 2023-05-17 | Disposition: A | Discharge status: Home / Self Care | Payer: Medicare HMO | Attending: Emergency Medicine | Admitting: Emergency Medicine

## 2023-05-16 ENCOUNTER — Emergency Department (HOSPITAL_COMMUNITY): Payer: Medicare HMO

## 2023-05-16 ENCOUNTER — Other Ambulatory Visit: Payer: Self-pay

## 2023-05-16 DIAGNOSIS — Y92410 Unspecified street and highway as the place of occurrence of the external cause: Secondary | ICD-10-CM | POA: Insufficient documentation

## 2023-05-16 DIAGNOSIS — M545 Low back pain, unspecified: Secondary | ICD-10-CM | POA: Diagnosis present

## 2023-05-16 DIAGNOSIS — Z1152 Encounter for screening for COVID-19: Secondary | ICD-10-CM | POA: Insufficient documentation
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
## 2023-05-17 NOTE — ED Notes (Signed)
Called in waiting room, no ans

## 2023-07-12 ENCOUNTER — Ambulatory Visit (HOSPITAL_COMMUNITY)
Admission: EM | Admit: 2023-07-12 | Discharge: 2023-07-12 | Disposition: A | Discharge status: Home / Self Care | Payer: Medicare (Managed Care)

## 2023-07-12 ENCOUNTER — Encounter (HOSPITAL_COMMUNITY): Payer: Self-pay

## 2023-07-12 ENCOUNTER — Ambulatory Visit (INDEPENDENT_AMBULATORY_CARE_PROVIDER_SITE_OTHER): Payer: Medicare (Managed Care)

## 2023-07-12 DIAGNOSIS — R051 Acute cough: Secondary | ICD-10-CM

## 2023-07-12 DIAGNOSIS — J069 Acute upper respiratory infection, unspecified: Secondary | ICD-10-CM

## 2023-07-12 DIAGNOSIS — R062 Wheezing: Secondary | ICD-10-CM | POA: Diagnosis not present

## 2023-07-12 HISTORY — DX: Cyst of kidney, acquired: N28.1

## 2023-07-12 MED ORDER — AZITHROMYCIN 250 MG PO TABS: ORAL_TABLET | ORAL | 0 refills | Status: DC

## 2023-07-12 MED ORDER — IPRATROPIUM-ALBUTEROL 0.5-2.5 (3) MG/3ML IN SOLN
RESPIRATORY_TRACT | Status: AC
Start: 2023-07-12 — End: ?
  Filled 2023-07-12: qty 3

## 2023-07-12 MED ORDER — DEXAMETHASONE SODIUM PHOSPHATE 10 MG/ML IJ SOLN
INTRAMUSCULAR | Status: AC
  Filled 2023-07-12: qty 1

## 2023-07-12 MED ORDER — ALBUTEROL SULFATE HFA 108 (90 BASE) MCG/ACT IN AERS: 1.0000 | INHALATION_SPRAY | Freq: Four times a day (QID) | RESPIRATORY_TRACT | 0 refills | Status: AC | PRN

## 2023-07-12 MED ORDER — ALBUTEROL SULFATE (2.5 MG/3ML) 0.083% IN NEBU
2.5000 mg | INHALATION_SOLUTION | Freq: Once | RESPIRATORY_TRACT | Status: AC
  Administered 2023-07-12: 2.5 mg via RESPIRATORY_TRACT

## 2023-07-12 MED ORDER — DEXAMETHASONE SODIUM PHOSPHATE 10 MG/ML IJ SOLN
10.0000 mg | Freq: Once | INTRAMUSCULAR | Status: AC
  Administered 2023-07-12: 10 mg via INTRAMUSCULAR

## 2023-07-12 MED ORDER — ALBUTEROL SULFATE (2.5 MG/3ML) 0.083% IN NEBU
INHALATION_SOLUTION | RESPIRATORY_TRACT | Status: AC
  Filled 2023-07-12: qty 3

## 2023-07-12 MED ORDER — IPRATROPIUM-ALBUTEROL 0.5-2.5 (3) MG/3ML IN SOLN
3.0000 mL | Freq: Once | RESPIRATORY_TRACT | Status: AC
  Administered 2023-07-12: 3 mL via RESPIRATORY_TRACT

## 2023-07-12 MED ORDER — BENZONATATE 100 MG PO CAPS: 100.0000 mg | ORAL_CAPSULE | Freq: Three times a day (TID) | ORAL | 0 refills | Status: AC

## 2023-07-12 NOTE — Discharge Instructions (Signed)
Start taking azithromycin with taking 2 tablets today and 1 tablet on the remaining 4 days for upper respiratory infection.  You can take Tessalon every 8 hours as needed for cough.  I have also prescribed an albuterol inhaler that you can use every 6 hours as needed for wheezing or shortness of breath.  Return here if symptoms persist or worsen.

## 2023-07-12 NOTE — ED Triage Notes (Signed)
Pt c/o of a productive cough, chest congestion, bilateral ear pain, headache, loss of appetite, fatigue, lymph node tenderness, head congestion, nose bleeds, and sweats.   Start Date: Around 07/02/2023  Home Interventions: Mucinex Max, [employer]

## 2023-07-13 NOTE — ED Provider Notes (Signed)
[facility]    CSN: [id number] Arrival date & time: 07/12/23  1050      History   Chief Complaint Chief Complaint  Patient presents with   Cough   Otalgia    HPI Yvonne Eaton is a 73 y.o. female.   Patient presents with productive cough, chest congestion, bilateral ear pain, headache, loss of appetite, fatigue, lymph node tenderness, head congestion, intermittent nosebleeds, and sweats x 11 days.  Denies shortness of breath, chest pain, known fever, abdominal pain, nausea, vomiting, and diarrhea.   Cough Associated symptoms: chills, ear pain, headaches and rhinorrhea   Associated symptoms: no chest pain, no fever and no shortness of breath   Otalgia Associated symptoms: congestion, cough, headaches and rhinorrhea   Associated symptoms: no abdominal pain, no diarrhea, no fever and no vomiting     Past Medical History:  Diagnosis Date   Kidney cysts     There are no active problems to display for this patient.   Past Surgical History:  Procedure Laterality Date   BLADDER SUSPENSION     CHOLECYSTECTOMY     GASTRIC BYPASS      OB History   No obstetric history on file.      Home Medications    Prior to Admission medications   Medication Sig Start Date End Date Taking? Authorizing Provider  albuterol (VENTOLIN HFA) 108 (90 Base) MCG/ACT inhaler Inhale 1-2 puffs into the lungs every 6 (six) hours as needed for wheezing or shortness of breath. 07/12/23  Yes Susann Givens, Eriyana Sweeten A, NP  azithromycin (ZITHROMAX Z-PAK) 250 MG tablet Take 2 pills (500mg ) first day and one pill (250mg ) the remaining 4 days. 07/12/23  Yes Susann Givens, Tahari Clabaugh A, NP  benzonatate (TESSALON) 100 MG capsule Take 1 capsule (100 mg total) by mouth every 8 (eight) hours. 07/12/23  Yes Susann Givens, Hoda Hon A, NP  buPROPion (WELLBUTRIN) 100 MG tablet Take by mouth.   Yes [provider]  celecoxib (CELEBREX) 200 MG capsule take 1 capsule by mouth twice daily as needed for pain 03/10/23  Yes  [provider]  Cholecalciferol (VITAMIN D3) 50 MCG (2000 UT) capsule Take by mouth.   Yes [provider]  cyanocobalamin (VITAMIN B12) 1000 MCG tablet Take by mouth.   Yes [provider]  ibuprofen (ADVIL) 200 MG tablet Take 200 mg by mouth every 6 (six) hours as needed.   Yes [provider]  magnesium 30 MG tablet Take 30 mg by mouth 2 (two) times daily.   Yes [provider]  promethazine-dextromethorphan (PROMETHAZINE-DM) 6.25-15 MG/5ML syrup Take 5 mLs by mouth at bedtime as needed for cough. 03/31/23  Yes Carlisle Beers, FNP  atorvastatin (LIPITOR) 10 MG tablet Take 10 mg by mouth daily. 02/03/23   [provider]  cetirizine (ZYRTEC) 10 MG tablet Take by mouth.    [provider]    Family History History reviewed. No pertinent family history.  Social History Social History   Tobacco Use   Smoking status: Former    Types: Cigarettes   Smokeless tobacco: Never  Substance Use Topics   Alcohol use: Yes    Comment: occasionally   Drug use: Yes    Types: Marijuana    Comment: Former Meth user     Allergies   Morphine   Review of Systems Review of Systems  Constitutional:  Positive for appetite change, chills and fatigue. Negative for activity change and fever.  HENT:  Positive for congestion, ear pain and rhinorrhea.  Respiratory:  Positive for cough. Negative for chest tightness and shortness of breath.   Cardiovascular:  Negative for chest pain.  Gastrointestinal:  Negative for abdominal pain, diarrhea, nausea and vomiting.  Neurological:  Positive for headaches. Negative for weakness.     Physical Exam Triage Vital Signs ED Triage Vitals  Encounter Vitals Group     BP 07/12/23 1227 (!) 166/72     Systolic BP Percentile --      Diastolic BP Percentile --      Pulse Rate 07/12/23 1227 62     Resp 07/12/23 1227 18     Temp 07/12/23 1227 98.2 F (36.8 C)     Temp Source 07/12/23 1227 Oral      SpO2 07/12/23 1227 99 %     Weight --      Height --      Head Circumference --      Peak Flow --      Pain Score 07/12/23 1221 0     Pain Loc --      Pain Education --      Exclude from Growth Chart --    No data found.  Updated Vital Signs BP (!) 166/72 (BP Location: Right Arm)   Pulse 62   Temp 98.2 F (36.8 C) (Oral)   Resp 18   SpO2 99%   Visual Acuity Right Eye Distance:   Left Eye Distance:   Bilateral Distance:    Right Eye Near:   Left Eye Near:    Bilateral Near:     Physical Exam Vitals and nursing note reviewed.  Constitutional:      General: She is awake. She is not in acute distress.    Appearance: Normal appearance. She is well-developed and well-groomed. She is not ill-appearing.  HENT:     Right Ear: Tympanic membrane, ear canal and external ear normal.     Left Ear: Tympanic membrane, ear canal and external ear normal.     Nose: Congestion and rhinorrhea present.     Mouth/Throat:     Mouth: Mucous membranes are moist.     Pharynx: Posterior oropharyngeal erythema present. No oropharyngeal exudate.  Cardiovascular:     Rate and Rhythm: Normal rate and regular rhythm.  Pulmonary:     Effort: Pulmonary effort is normal. No respiratory distress.     Breath sounds: Wheezing present.  Musculoskeletal:        General: Normal range of motion.     Cervical back: Normal range of motion and neck supple.  Skin:    General: Skin is warm and dry.  Neurological:     Mental Status: She is alert.  Psychiatric:        Behavior: Behavior is cooperative.      UC Treatments / Results  Labs (all labs ordered are listed, but only abnormal results are displayed) Labs Reviewed - No data to display  EKG   Radiology DG Chest 2 View Result Date: 07/12/2023 CLINICAL DATA:  Cough and wheezing. EXAM: CHEST - 2 VIEW COMPARISON:  03/31/2023. FINDINGS: Trachea is midline. Heart size normal. Lungs are clear. No pleural fluid. Flowing anterior osteophytosis in  the thoracic spine. IMPRESSION: No acute findings. Electronically Signed   By: Leanna Battles M.D.   On: 07/12/2023 13:41    Procedures Procedures (including critical care time)  Medications Ordered in UC Medications  ipratropium-albuterol (DUONEB) 0.5-2.5 (3) MG/3ML nebulizer solution 3 mL (3 mLs Nebulization Given 07/12/23 1333)  albuterol (PROVENTIL) (2.5 MG/3ML)  0.083% nebulizer solution 2.5 mg (2.5 mg Nebulization Given 07/12/23 1420)  dexamethasone (DECADRON) injection 10 mg (10 mg Intramuscular Given 07/12/23 1418)    Initial Impression / Assessment and Plan / UC Course  I have reviewed the triage vital signs and the nursing notes.  Pertinent labs & imaging results that were available during my care of the patient were reviewed by me and considered in my medical decision making (see chart for details).     Patient presented with 11-day history of productive cough, chest congestion, bilateral ear pain, headache, loss of appetite, fatigue, lymph node tenderness, head congestion, intermittent nosebleeds, and sweats.  Denies any other symptoms.  Upon assessment congestion and rhinorrhea are present, mild erythema noted to pharynx.  Significant wheezing heard throughout all lung fields.  Given DuoNeb DuoNeb and albuterol nebulizers and Decadron injection in clinic with relief of wheezing.  X-ray did not reveal any acute findings.  Prescribed azithromycin for upper respiratory infection, prescribed Tessalon as needed for cough, prescribed albuterol inhaler as needed for shortness of breath and wheezing.  Discussed return and strict ER precautions.  Final diagnoses:  Acute cough  Acute upper respiratory infection  Wheezing     Discharge Instructions      Start taking azithromycin with taking 2 tablets today and 1 tablet on the remaining 4 days for upper respiratory infection.  You can take Tessalon every 8 hours as needed for cough.  I have also prescribed an albuterol inhaler that you  can use every 6 hours as needed for wheezing or shortness of breath.  Return here if symptoms persist or worsen.    ED Prescriptions     Medication Sig Dispense Auth. Provider   azithromycin (ZITHROMAX Z-PAK) 250 MG tablet Take 2 pills (500mg ) first day and one pill (250mg ) the remaining 4 days. 6 tablet Wynonia Lawman A, NP   benzonatate (TESSALON) 100 MG capsule Take 1 capsule (100 mg total) by mouth every 8 (eight) hours. 21 capsule Susann Givens, Christinamarie Tall A, NP   albuterol (VENTOLIN HFA) 108 (90 Base) MCG/ACT inhaler Inhale 1-2 puffs into the lungs every 6 (six) hours as needed for wheezing or shortness of breath. 6.7 g Wynonia Lawman A, NP      PDMP not reviewed this encounter.   Wynonia Lawman A, NP 07/13/23 1039

## 2023-08-25 ENCOUNTER — Ambulatory Visit
Admission: RE | Admit: 2023-08-25 | Discharge: 2023-08-25 | Disposition: A | Discharge status: Home / Self Care | Payer: Medicare (Managed Care) | Source: Ambulatory Visit | Attending: Nurse Practitioner | Admitting: Nurse Practitioner

## 2023-08-25 DIAGNOSIS — E2839 Other primary ovarian failure: Secondary | ICD-10-CM

## 2023-10-23 ENCOUNTER — Other Ambulatory Visit: Payer: Self-pay | Admitting: Surgery

## 2023-10-23 DIAGNOSIS — Z9884 Bariatric surgery status: Secondary | ICD-10-CM

## 2023-10-23 DIAGNOSIS — R1013 Epigastric pain: Secondary | ICD-10-CM

## 2023-11-13 ENCOUNTER — Other Ambulatory Visit: Payer: Medicare (Managed Care)

## 2023-11-27 ENCOUNTER — Ambulatory Visit
Admission: RE | Admit: 2023-11-27 | Discharge: 2023-11-27 | Disposition: A | Discharge status: Home / Self Care | Payer: Medicare (Managed Care) | Source: Ambulatory Visit | Attending: Surgery | Admitting: Surgery

## 2023-11-27 DIAGNOSIS — R1013 Epigastric pain: Secondary | ICD-10-CM

## 2023-11-27 DIAGNOSIS — Z9884 Bariatric surgery status: Secondary | ICD-10-CM

## 2023-12-26 ENCOUNTER — Other Ambulatory Visit (HOSPITAL_BASED_OUTPATIENT_CLINIC_OR_DEPARTMENT_OTHER): Payer: Self-pay | Admitting: Nurse Practitioner

## 2023-12-26 DIAGNOSIS — E2839 Other primary ovarian failure: Secondary | ICD-10-CM

## 2024-01-09 LAB — COLOGUARD: COLOGUARD: NEGATIVE

## 2024-01-15 ENCOUNTER — Institutional Professional Consult (permissible substitution): Payer: Medicare (Managed Care) | Admitting: Family Medicine

## 2024-01-29 ENCOUNTER — Encounter: Payer: Self-pay | Admitting: Family Medicine

## 2024-01-29 ENCOUNTER — Ambulatory Visit (INDEPENDENT_AMBULATORY_CARE_PROVIDER_SITE_OTHER): Payer: Medicare (Managed Care) | Admitting: Family Medicine

## 2024-01-29 VITALS — BP 144/84 | HR 66 | Temp 98.0°F | Ht 63.0 in | Wt 192.0 lb

## 2024-01-29 DIAGNOSIS — E66811 Obesity, class 1: Secondary | ICD-10-CM | POA: Diagnosis not present

## 2024-01-29 DIAGNOSIS — Z6834 Body mass index (BMI) 34.0-34.9, adult: Secondary | ICD-10-CM

## 2024-01-29 DIAGNOSIS — F1591 Other stimulant use, unspecified, in remission: Secondary | ICD-10-CM

## 2024-01-29 DIAGNOSIS — Z9884 Bariatric surgery status: Secondary | ICD-10-CM | POA: Diagnosis not present
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
## 2024-02-16 ENCOUNTER — Encounter: Payer: Self-pay | Admitting: Family Medicine

## 2024-02-16 ENCOUNTER — Ambulatory Visit (INDEPENDENT_AMBULATORY_CARE_PROVIDER_SITE_OTHER): Payer: Medicare (Managed Care) | Admitting: Family Medicine

## 2024-02-16 VITALS — BP 123/75 | HR 64 | Temp 97.9°F | Ht 63.0 in | Wt 194.0 lb

## 2024-02-16 DIAGNOSIS — R635 Abnormal weight gain: Secondary | ICD-10-CM

## 2024-02-16 DIAGNOSIS — F419 Anxiety disorder, unspecified: Secondary | ICD-10-CM | POA: Diagnosis not present

## 2024-02-16 DIAGNOSIS — R5383 Other fatigue: Secondary | ICD-10-CM

## 2024-02-16 DIAGNOSIS — Z6834 Body mass index (BMI) 34.0-34.9, adult: Secondary | ICD-10-CM

## 2024-02-16 DIAGNOSIS — K912 Postsurgical malabsorption, not elsewhere classified: Secondary | ICD-10-CM | POA: Diagnosis not present

## 2024-02-16 DIAGNOSIS — I10 Essential (primary) hypertension: Secondary | ICD-10-CM | POA: Diagnosis not present

## 2024-02-16 DIAGNOSIS — R0602 Shortness of breath: Secondary | ICD-10-CM

## 2024-02-16 DIAGNOSIS — G4730 Sleep apnea, unspecified: Secondary | ICD-10-CM | POA: Diagnosis not present

## 2024-02-16 DIAGNOSIS — F32A Depression, unspecified: Secondary | ICD-10-CM

## 2024-02-16 DIAGNOSIS — Z9884 Bariatric surgery status: Secondary | ICD-10-CM

## 2024-02-16 DIAGNOSIS — F1591 Other stimulant use, unspecified, in remission: Secondary | ICD-10-CM | POA: Diagnosis not present
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
## 2024-02-21 LAB — CBC WITH DIFFERENTIAL/PLATELET
Basophils Absolute: 0.1 x10E3/uL (ref 0.0–0.2)
Basos: 1 %
EOS (ABSOLUTE): 0.1 x10E3/uL (ref 0.0–0.4)
Eos: 2 %
Hematocrit: 41.3 % (ref 34.0–46.6)
Hemoglobin: 13.4 g/dL (ref 11.1–15.9)
Immature Grans (Abs): 0 x10E3/uL (ref 0.0–0.1)
Immature Granulocytes: 0 %
Lymphocytes Absolute: 1.3 x10E3/uL (ref 0.7–3.1)
Lymphs: 29 %
MCH: 30.8 pg (ref 26.6–33.0)
MCHC: 32.4 g/dL (ref 31.5–35.7)
MCV: 95 fL (ref 79–97)
Monocytes Absolute: 0.4 x10E3/uL (ref 0.1–0.9)
Monocytes: 9 %
Neutrophils Absolute: 2.8 x10E3/uL (ref 1.4–7.0)
Neutrophils: 59 %
Platelets: 207 x10E3/uL (ref 150–450)
RBC: 4.35 x10E6/uL (ref 3.77–5.28)
RDW: 13.6 % (ref 11.7–15.4)
WBC: 4.7 x10E3/uL (ref 3.4–10.8)

## 2024-02-21 LAB — T4, FREE: Free T4: 1.16 ng/dL (ref 0.82–1.77)

## 2024-02-21 LAB — COMPREHENSIVE METABOLIC PANEL WITH GFR
ALT: 14 IU/L (ref 0–32)
AST: 18 IU/L (ref 0–40)
Albumin: 4.6 g/dL (ref 3.8–4.8)
Alkaline Phosphatase: 80 IU/L (ref 44–121)
BUN/Creatinine Ratio: 19 (ref 12–28)
BUN: 16 mg/dL (ref 8–27)
Bilirubin Total: 0.5 mg/dL (ref 0.0–1.2)
CO2: 23 mmol/L (ref 20–29)
Calcium: 9.7 mg/dL (ref 8.7–10.3)
Chloride: 104 mmol/L (ref 96–106)
Creatinine, Ser: 0.85 mg/dL (ref 0.57–1.00)
Globulin, Total: 2.2 g/dL (ref 1.5–4.5)
Glucose: 77 mg/dL (ref 70–99)
Potassium: 4.3 mmol/L (ref 3.5–5.2)
Sodium: 142 mmol/L (ref 134–144)
Total Protein: 6.8 g/dL (ref 6.0–8.5)
eGFR: 73 mL/min/1.73 (ref >59)

## 2024-02-21 LAB — IRON AND TIBC
Iron Saturation: 28 % (ref 15–55)
Iron: 106 ug/dL (ref 27–139)
Total Iron Binding Capacity: 379 ug/dL (ref 250–450)
UIBC: 273 ug/dL (ref 118–369)

## 2024-02-21 LAB — FERRITIN: Ferritin: 29 ng/mL (ref 15–150)

## 2024-02-21 LAB — LIPID PANEL
Chol/HDL Ratio: 2.6 ratio (ref 0.0–4.4)
Cholesterol, Total: 221 mg/dL — ABNORMAL HIGH (ref 100–199)
HDL: 86 mg/dL (ref >39)
LDL Chol Calc (NIH): 122 mg/dL — ABNORMAL HIGH (ref 0–99)
Triglycerides: 73 mg/dL (ref 0–149)
VLDL Cholesterol Cal: 13 mg/dL (ref 5–40)

## 2024-02-21 LAB — INSULIN, RANDOM: INSULIN: 3.8 u[IU]/mL (ref 2.6–24.9)

## 2024-02-21 LAB — PREALBUMIN: PREALBUMIN: 21 mg/dL (ref 9–32)

## 2024-02-21 LAB — T3: T3, Total: 103 ng/dL (ref 71–180)

## 2024-02-21 LAB — VITAMIN B12: Vitamin B-12: >2000 pg/mL — ABNORMAL HIGH (ref 232–1245)

## 2024-02-21 LAB — HEMOGLOBIN A1C
Est. average glucose Bld gHb Est-mCnc: 100 mg/dL
Hgb A1c MFr Bld: 5.1 % (ref 4.8–5.6)

## 2024-02-21 LAB — VITAMIN B1: Thiamine: 104.2 nmol/L (ref 66.5–200.0)

## 2024-02-21 LAB — TSH: TSH: 2.12 u[IU]/mL (ref 0.450–4.500)

## 2024-02-21 LAB — VITAMIN D 25 HYDROXY (VIT D DEFICIENCY, FRACTURES): Vit D, 25-Hydroxy: 82.4 ng/mL (ref 30.0–100.0)

## 2024-02-21 LAB — FOLATE: Folate: 12.5 ng/mL (ref >3.0)

## 2024-02-23 ENCOUNTER — Ambulatory Visit: Payer: Self-pay | Admitting: Family Medicine

## 2024-03-01 ENCOUNTER — Encounter: Payer: Self-pay | Admitting: Family Medicine

## 2024-03-01 ENCOUNTER — Ambulatory Visit (INDEPENDENT_AMBULATORY_CARE_PROVIDER_SITE_OTHER): Payer: Medicare (Managed Care) | Admitting: Family Medicine

## 2024-03-01 VITALS — BP 123/76 | HR 70 | Temp 98.5°F | Ht 63.0 in | Wt 193.0 lb

## 2024-03-01 DIAGNOSIS — F419 Anxiety disorder, unspecified: Secondary | ICD-10-CM

## 2024-03-01 DIAGNOSIS — F32A Depression, unspecified: Secondary | ICD-10-CM | POA: Diagnosis not present

## 2024-03-01 DIAGNOSIS — E785 Hyperlipidemia, unspecified: Secondary | ICD-10-CM | POA: Insufficient documentation

## 2024-03-01 DIAGNOSIS — E66811 Other obesity due to excess calories: Secondary | ICD-10-CM

## 2024-03-01 DIAGNOSIS — E678 Other specified hyperalimentation: Secondary | ICD-10-CM

## 2024-03-01 DIAGNOSIS — Z9884 Bariatric surgery status: Secondary | ICD-10-CM | POA: Diagnosis not present

## 2024-03-01 DIAGNOSIS — Z6834 Body mass index (BMI) 34.0-34.9, adult: Secondary | ICD-10-CM

## 2024-03-01 MED ORDER — NALTREXONE HCL 50 MG PO TABS: 25.0000 mg | ORAL_TABLET | Freq: Every day | ORAL | 0 refills | Status: DC

## 2024-03-01 NOTE — Patient Instructions (Addendum)
 Try adding a protein shake and a greek yogurt to your day to get in your protein needs Avoid products with over 8 g of added sugar per serving  Reduce Vitamin B12 to 500 mcg once a day  We will follow up your sleep study referral  Begin Naltrexone  1/2 tab with DINNER daily for cravings/ emotional eating Continue Buproprion SR 150 mg 2 x a day  Keep sweets to a minimum Try to increase daily steps  Hydrate well with water Add in a packet or two of Propel or GZERO - this can help with muscle cramps

## 2024-03-01 NOTE — Progress Notes (Signed)
 Office: [phone]  /  Fax: [phone]  WEIGHT SUMMARY AND BIOMETRICS  Starting Date: 02/16/24  Starting Weight: 194lb   Weight Lost Since Last Visit: 1lb   Vitals Temp: 98.5 F (36.9 C) BP: 123/76 Pulse Rate: 70 SpO2: 97 %   Body Composition  Body Fat %: 47.8 % Fat Mass (lbs): 92.6 lbs Muscle Mass (lbs): 95.8 lbs Total Body Water (lbs): 81.2 lbs Visceral Fat Rating : 15    HPI  Chief Complaint: OBESITY  Mack is here to discuss her progress with her obesity treatment plan. She is on the the Category 2 Plan and states she is following her eating plan approximately 0 % of the time. She states she is exercising 0 minutes 0 times per week.  Interval History:  Since last office visit she is down 1 lb She is down 0.8 lb of muscle mass and up 0.4 lb of body fat She admits to not following her plan She has 'no food at home' but is eating less sweets at work She has sugar cravings but has cut back on candy bars and cookies She is struggling to get in enough protein She is not exercising due to pain  She has not heard about her sleep medicine referral, still struggling with poor sleep and nighttime awakenings  Pharmacotherapy: none  PHYSICAL EXAM:  Blood pressure 123/76, pulse 70, temperature 98.5 F (36.9 C), height 5' 3 (1.6 m), weight 193 lb (87.5 kg), SpO2 97%. Body mass index is 34.19 kg/m.  General: She is overweight, cooperative, alert, well developed, and in no acute distress. PSYCH: Has normal mood, affect and thought process.   Lungs: Normal breathing effort, no conversational dyspnea.   ASSESSMENT AND PLAN  TREATMENT PLAN FOR OBESITY:  Recommended Dietary Goals  Naudia is currently in the action stage of change. As such, her goal is to continue weight management plan. She has agreed to the Category 2 Plan.  Behavioral Intervention  We discussed the following Behavioral Modification Strategies today: increasing lean protein intake to  established goals, increasing fiber rich foods, increasing water intake , work on meal planning and preparation, keeping healthy foods at home, work on managing stress, creating time for self-care and relaxation, continue to work on implementation of reduced calorie nutritional plan, continue to practice mindfulness when eating, and planning for success.  Additional resources provided today: NA  Recommended Physical Activity Goals  Saga has been advised to work up to 150 minutes of moderate intensity aerobic activity a week and strengthening exercises 2-3 times per week for cardiovascular health, weight loss maintenance and preservation of muscle mass.   She has agreed to Think about enjoyable ways to increase daily physical activity and overcoming barriers to exercise and Increase physical activity in their day and reduce sedentary time (increase NEAT).  Pharmacotherapy changes for the treatment of obesity: add Naltrexone  25 mg with dinner daily (already on Buproprion SR 150 mg bid)  ASSOCIATED CONDITIONS ADDRESSED TODAY  Weight gain status post gastric bypass Begin prescribed dietary plan Reviewed plan of care on AVS She is still struggling to get in adequate lean protein, eating an abundance of carbs/ added sugar Begin Naltrexone  1/2 tab with dinner daily -     Naltrexone  HCl; Take 0.5 tablets (25 mg total) by mouth daily.  Dispense: 15 tablet; Refill: 0  Class 1 obesity due to excess calories with body mass index (BMI) of 34.0 to 34.9 in adult, unspecified whether serious comorbidity present  Excessive vitamin B12 intake  Lab Results  Component Value Date   VITAMINB12 >2000 (H) 02/16/2024  Reviewed labs with patient She has been taking vitamin B12 1000 mcg daily She will reduce to 500 mcg daily  Hyperlipidemia, unspecified hyperlipidemia type Lab Results  Component Value Date   CHOL 221 (H) 02/16/2024   HDL 86 02/16/2024   LDLCALC 122 (H) 02/16/2024   TRIG 73 02/16/2024    CHOLHDL 2.6 02/16/2024   Reviewed labs with patient She is taking atorvastatin 10 mg daily doing well on this w/o adverse SE Continue to work on a low saturated fat diet Assessment & Plan: The 10-year ASCVD risk score (Arnett DK, et al., 2019) is: 9.4%   Values used to calculate the score:     Age: 73 years     Clincally relevant sex: Female     Is Non-Hispanic African American: No     Diabetic: No     Tobacco smoker: No     Systolic Blood Pressure: 123 mmHg     Is BP treated: No     HDL Cholesterol: 86 mg/dL     Total Cholesterol: 221 mg/dL    Anxiety and depression Doing well on Buproprion SR 150 mg bid and is awaiting behavioral health visit    She was informed of the importance of frequent follow up visits to maximize her success with intensive lifestyle modifications for her multiple health conditions.   ATTESTASTION STATEMENTS:  Reviewed by clinician on day of visit: allergies, medications, problem list, medical history, surgical history, family history, social history, and previous encounter notes pertinent to obesity diagnosis.   I have personally spent 35 minutes total time today in preparation, patient care, nutritional counseling and education,  and documentation for this visit, including the following: review of most recent clinical lab tests, prescribing medications/ refilling medications, reviewing medical assistant documentation, review and interpretation of bioimpedence results.     Darice Haddock, D.O. DABFM, DABOM [facility] [address] [place], [place] [address] [phone]

## 2024-03-01 NOTE — Assessment & Plan Note (Signed)
 The 10-year ASCVD risk score (Arnett DK, et al., 2019) is: 9.4%   Values used to calculate the score:     Age: 73 years     Clincally relevant sex: Female     Is Non-Hispanic African American: No     Diabetic: No     Tobacco smoker: No     Systolic Blood Pressure: 123 mmHg     Is BP treated: No     HDL Cholesterol: 86 mg/dL     Total Cholesterol: 221 mg/dL

## 2024-03-29 ENCOUNTER — Encounter: Payer: Self-pay | Admitting: Family Medicine

## 2024-03-29 ENCOUNTER — Ambulatory Visit: Payer: Medicare (Managed Care) | Admitting: Family Medicine

## 2024-03-29 VITALS — BP 147/80 | HR 63 | Temp 98.1°F | Ht 63.0 in | Wt 189.0 lb

## 2024-03-29 DIAGNOSIS — R635 Abnormal weight gain: Secondary | ICD-10-CM

## 2024-03-29 DIAGNOSIS — Z9884 Bariatric surgery status: Secondary | ICD-10-CM

## 2024-03-29 DIAGNOSIS — E66811 Obesity, class 1: Secondary | ICD-10-CM

## 2024-03-29 DIAGNOSIS — F32A Depression, unspecified: Secondary | ICD-10-CM | POA: Diagnosis not present

## 2024-03-29 DIAGNOSIS — N952 Postmenopausal atrophic vaginitis: Secondary | ICD-10-CM

## 2024-03-29 DIAGNOSIS — N3941 Urge incontinence: Secondary | ICD-10-CM

## 2024-03-29 DIAGNOSIS — Z6833 Body mass index (BMI) 33.0-33.9, adult: Secondary | ICD-10-CM

## 2024-03-29 DIAGNOSIS — F419 Anxiety disorder, unspecified: Secondary | ICD-10-CM

## 2024-03-29 MED ORDER — NALTREXONE HCL 50 MG PO TABS: 25.0000 mg | ORAL_TABLET | Freq: Every day | ORAL | 1 refills | Status: DC

## 2024-03-29 MED ORDER — ESTROGENS CONJUGATED 0.625 MG/GM VA CREA: 1.0000 | TOPICAL_CREAM | Freq: Every day | VAGINAL | 1 refills | Status: AC

## 2024-03-29 NOTE — Progress Notes (Signed)
 Office: [phone]  /  Fax: [phone]  WEIGHT SUMMARY AND BIOMETRICS  Starting Date: 02/16/24  Starting Weight: 194lb   Weight Lost Since Last Visit: 4lb   Vitals Temp: 98.1 F (36.7 C) BP: (!) 147/80 Pulse Rate: 63 SpO2: 99 %   Body Composition  Body Fat %: 44.7 % Fat Mass (lbs): 84.8 lbs Muscle Mass (lbs): 99.6 lbs Total Body Water (lbs): 77.6 lbs Visceral Fat Rating : 14     HPI  Chief Complaint: OBESITY  Yvonne Eaton is here to discuss her progress with her obesity treatment plan. She is on the the Category 2 Plan and states she is following her eating plan approximately 50 % of the time. She states she is moving more and walking daily.    Interval History:  Since last office visit she is down 4 lb She is net down 5 lb in the past month of medically supervised weight management She is working on improving compliance on cat 2 meal plan, doing better now with food choices at work She is doing better with food cravings with the addition of Naltrexone  She is taking the stairs more at work She is dealing with stress at home with a rat infestation She has been dealing with urge incontinence  Pharmacotherapy: Naltrexone  25 mg at night w/ dinner  PHYSICAL EXAM:  Blood pressure (!) 147/80, pulse 63, temperature 98.1 F (36.7 C), height 5' 3 (1.6 m), weight 189 lb (85.7 kg), SpO2 99%. Body mass index is 33.48 kg/m.  General: She is overweight, cooperative, alert, well developed, and in no acute distress. PSYCH: Has normal mood, affect and thought process.   Lungs: Normal breathing effort, no conversational dyspnea.   ASSESSMENT AND PLAN  TREATMENT PLAN FOR OBESITY:  Recommended Dietary Goals  Yvonne Eaton is currently in the action stage of change. As such, her goal is to continue weight management plan. She has agreed to the Category 2 Plan.  Behavioral Intervention  We discussed the following Behavioral Modification Strategies today: increasing lean  protein intake to established goals, increasing fiber rich foods, increasing water intake , work on meal planning and preparation, keeping healthy foods at home, identifying sources and decreasing liquid calories, planning for success, and better snacking choices.  Additional resources provided today: NA  Recommended Physical Activity Goals  Yvonne Eaton has been advised to work up to 150 minutes of moderate intensity aerobic activity a week and strengthening exercises 2-3 times per week for cardiovascular health, weight loss maintenance and preservation of muscle mass.   She has agreed to [employer] strengthening exercises with a goal of 2-3 sessions a week   Pharmacotherapy changes for the treatment of obesity: none  ASSOCIATED CONDITIONS ADDRESSED TODAY  Vaginal atrophy With menopause, she notes vaginal dryness and associated urinary incontinence previously on topical estrogen but ran out and has not seen OB here. Refilled her vaginal estrogen cream Recommend updating mammogram this year -     Estrogens Conjugated; Place 1 Applicatorful vaginally daily.  Dispense: 30 g; Refill: 1  Weight gain status post gastric bypass Improving Continue to work on meal planning, lean protein with meals and drinking water outside of meal time -     Naltrexone  HCl; Take 0.5 tablets (25 mg total) by mouth daily.  Dispense: 15 tablet; Refill: 1  Obesity, Class I, BMI 30-34.9 Improving Seeing bigger changes in body composition, reviewed bioimpedence results with patient  BMI 33.0-33.9,adult  Anxiety and depression Worsened by high stress at home Improving with use of Wellbutrin  150 mg bid with her PCP, continue  Urge incontinence of urine New, worsened by drinking coffee on long commute to work and worsening vaginal atrophy Resume vaginal estrogen cream.  Reduce coffee/ tea/ soda intake given worsening urgency  Improve water intake and allow time to void while at work.  If not improving, will refer to  urgology     She was informed of the importance of frequent follow up visits to maximize her success with intensive lifestyle modifications for her multiple health conditions.   ATTESTASTION STATEMENTS:  Reviewed by clinician on day of visit: allergies, medications, problem list, medical history, surgical history, family history, social history, and previous encounter notes pertinent to obesity diagnosis.   I have personally spent 38 minutes total time today in preparation, patient care, nutritional counseling and education,  and documentation for this visit, including the following: review of most recent clinical lab tests, prescribing medications/ refilling medications, reviewing medical assistant documentation, review and interpretation of bioimpedence results.     Yvonne Eaton, D.O. DABFM, DABOM [facility] [address] [place], [place] [address] [phone]

## 2024-03-29 NOTE — Patient Instructions (Signed)
 [place], [place], [place] to [employer] cherylann     03/02/24 11:00 AM Good Morning,    I am reaching out to schedule you for a Sleep Consultation at [facility] based on a referral we received on your behalf. If you would please send me your preference for day of the week and/or time of day I can send you my first available appointments. We have appointments Monday through Thursday usually as early as 9am and as late as 3pm. This office visit will take approximately 30-45 minutes and you must arrive 30 minutes prior to your appointment time for check in.   Please let me know if you have any other questions.   Thank you,  [facility]  Sleep Referral Coordinator

## 2024-04-26 ENCOUNTER — Ambulatory Visit: Payer: Medicare (Managed Care) | Admitting: Family Medicine

## 2024-05-03 ENCOUNTER — Encounter: Payer: Self-pay | Admitting: Family Medicine

## 2024-05-03 ENCOUNTER — Ambulatory Visit: Payer: Medicare (Managed Care) | Admitting: Family Medicine

## 2024-05-03 VITALS — BP 145/84 | HR 64 | Temp 97.9°F | Ht 63.0 in | Wt 191.0 lb

## 2024-05-03 DIAGNOSIS — Z9884 Bariatric surgery status: Secondary | ICD-10-CM

## 2024-05-03 DIAGNOSIS — I10 Essential (primary) hypertension: Secondary | ICD-10-CM | POA: Diagnosis not present

## 2024-05-03 DIAGNOSIS — F411 Generalized anxiety disorder: Secondary | ICD-10-CM

## 2024-05-03 DIAGNOSIS — E66811 Obesity, class 1: Secondary | ICD-10-CM | POA: Diagnosis not present

## 2024-05-03 DIAGNOSIS — Z6833 Body mass index (BMI) 33.0-33.9, adult: Secondary | ICD-10-CM

## 2024-05-03 DIAGNOSIS — G4730 Sleep apnea, unspecified: Secondary | ICD-10-CM

## 2024-05-03 DIAGNOSIS — R635 Abnormal weight gain: Secondary | ICD-10-CM

## 2024-05-03 MED ORDER — NALTREXONE HCL 50 MG PO TABS
25.0000 mg | ORAL_TABLET | Freq: Every day | ORAL | 2 refills | Status: DC
Start: 2024-05-03 — End: 2024-05-03

## 2024-05-03 MED ORDER — NALTREXONE HCL 50 MG PO TABS
25.0000 mg | ORAL_TABLET | Freq: Every day | ORAL | 2 refills | Status: AC
Start: 2024-05-03 — End: ?

## 2024-05-03 NOTE — Progress Notes (Signed)
 Office: [phone]  /  Fax: [phone]  WEIGHT SUMMARY AND BIOMETRICS  Starting Date: 02/16/24  Starting Weight: 194lb   Weight Lost Since Last Visit: 0lb   Vitals Temp: 97.9 F (36.6 C) BP: (!) 145/84 Pulse Rate: 64 SpO2: 98 %   Body Composition  Body Fat %: 47.6 % Fat Mass (lbs): 91 lbs Muscle Mass (lbs): 95 lbs Visceral Fat Rating : 14   HPI  Chief Complaint: OBESITY  Yvonne Eaton is here to discuss her progress with her obesity treatment plan. She is on the the Category 2 Plan and states she is following her eating plan approximately 5 % of the time. She states she is exercising 0 minutes 0 times per week.  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 She did have to move due to a rat infestation but is enjoying her new living environment better She has not yet scheduled a sleep study (not able to use her Mychart) She has had to eat out more but is mindful  She plans to cook at home more She is taking the stairs more (no formal exercise) Vaginal estrogen cream is helping with urinary symptoms.  Pharmacotherapy: Naltrexone  25 mg once daily  PHYSICAL EXAM:  Blood pressure (!) 145/84, pulse 64, temperature 97.9 F (36.6 C), height 5' 3 (1.6 m), weight 191 lb (86.6 kg), SpO2 98%. Body mass index is 33.83 kg/m.  General: She is overweight, cooperative, alert, well developed, and in no acute distress. PSYCH: Has normal mood, affect and thought process.   Lungs: Normal breathing effort, no conversational dyspnea.   ASSESSMENT AND PLAN  TREATMENT PLAN FOR OBESITY:  Recommended Dietary Goals  Dalynn is currently in the action stage of change. As such, her goal is to continue weight management plan. She has agreed to keeping a food journal and adhering to recommended  goals of 1200 calories and grams of protein and following a lower carbohydrate, vegetable and lean protein rich diet plan.  Behavioral Intervention  We discussed the following Behavioral Modification Strategies today: increasing lean protein intake to established goals, decreasing simple carbohydrates , increasing vegetables, increasing fiber rich foods, keeping healthy foods at home, practice mindfulness eating and understand the difference between hunger signals and cravings, work on managing stress, creating time for self-care and relaxation, avoiding temptations and identifying enticing environmental cues, continue to practice mindfulness when eating, and planning for success.  Additional resources provided today: NA  Recommended Physical Activity Goals  Kamyla has been advised to work up to 150 minutes of moderate intensity aerobic activity a week and strengthening exercises 2-3 times per week for cardiovascular health, weight loss maintenance and preservation of muscle mass.   She has agreed to Think about enjoyable ways to increase daily physical activity and overcoming barriers to exercise, Increase physical activity in their day and reduce sedentary time (increase NEAT)., and Start aerobic activity with a goal of 150 minutes a week at moderate intensity.  The lack of regular physical activity has been a factor in her lack of weight loss.  Pharmacotherapy changes for the treatment of obesity: None  ASSOCIATED CONDITIONS ADDRESSED TODAY  Weight gain status post gastric bypass Naltrexone  has  helped with carb and sugar cravings without adverse side effects She is already on Wellbutrin 100 mg  tab, 1-1/2 tabs twice daily by her PCP for depressed mood Continue to work on compliance with dietary tracking and a low-carb, high-protein post-bariatric surgery diet  -     Naltrexone  HCl; Take 0.5 tablets (25 mg total) by mouth daily.  Dispense: 15 tablet; Refill: 2  Obesity, Class I, BMI  30-34.9 Stable She remains in the contemplative phase of lifestyle change for both diet and exercise She hopes to lose at least 30 more pounds next year Medication options are limited due to insurance coverage, history of substance abuse  BMI 33.0-33.9,adult  Sleep apnea, unspecified type She has been referred to sleep medicine for repeat polysomnogram.  She has been contacted by [facility].  GAD (generalized anxiety disorder) Anxiety has improved after moving to a new location.  Stress levels still remain fairly high with work.  Consider the addition of cognitive behavioral therapy.  Primary hypertension Blood pressure remains elevated.  She is encouraged to follow-up with her PCP for further evaluation and treatment.     She was informed of the importance of frequent follow up visits to maximize her success with intensive lifestyle modifications for her multiple health conditions.   ATTESTASTION STATEMENTS:  Reviewed by clinician on day of visit: allergies, medications, problem list, medical history, surgical history, family history, social history, and previous encounter notes pertinent to obesity diagnosis.   I have personally spent 30 minutes total time today in preparation, patient care, nutritional counseling and education,  and documentation for this visit, including the following: review of most recent clinical lab tests, prescribing medications/ refilling medications, reviewing medical assistant documentation, review and interpretation of bioimpedence results.     Yvonne Eaton, D.O. DABFM, DABOM [facility] [address] [place], [place] [address] [phone]

## 2024-06-25 ENCOUNTER — Ambulatory Visit
Admission: RE | Admit: 2024-06-25 | Discharge: 2024-06-25 | Disposition: A | Discharge status: Home / Self Care | Payer: Medicare (Managed Care) | Source: Ambulatory Visit | Attending: Nurse Practitioner | Admitting: Nurse Practitioner

## 2024-06-25 ENCOUNTER — Other Ambulatory Visit: Payer: Self-pay | Admitting: Nurse Practitioner

## 2024-06-25 DIAGNOSIS — M25562 Pain in left knee: Secondary | ICD-10-CM

## 2024-06-30 ENCOUNTER — Other Ambulatory Visit: Payer: Self-pay | Admitting: Nurse Practitioner

## 2024-06-30 DIAGNOSIS — Z1231 Encounter for screening mammogram for malignant neoplasm of breast: Secondary | ICD-10-CM

## 2024-07-06 ENCOUNTER — Ambulatory Visit: Payer: Medicare (Managed Care)

## 2024-07-12 ENCOUNTER — Ambulatory Visit: Payer: Medicare (Managed Care) | Admitting: Family Medicine

## 2024-07-16 ENCOUNTER — Ambulatory Visit: Admission: RE | Admit: 2024-07-16 | Payer: Medicare (Managed Care) | Source: Ambulatory Visit

## 2024-07-16 DIAGNOSIS — Z1231 Encounter for screening mammogram for malignant neoplasm of breast: Secondary | ICD-10-CM

## 2024-08-23 ENCOUNTER — Ambulatory Visit: Payer: Medicare (Managed Care) | Admitting: Family Medicine
